# Patient Record
Sex: Female | Born: 1980 | Race: White | Hispanic: No | Marital: Married | Smoking: Current every day smoker
Health system: Southern US, Community
[De-identification: ages and names within clinical notes are randomized; demographics above are authoritative.]

## PROBLEM LIST (undated history)

## (undated) DIAGNOSIS — I499 Cardiac arrhythmia, unspecified: Secondary | ICD-10-CM

## (undated) DIAGNOSIS — F419 Anxiety disorder, unspecified: Secondary | ICD-10-CM

## (undated) DIAGNOSIS — R569 Unspecified convulsions: Secondary | ICD-10-CM

## (undated) DIAGNOSIS — K8050 Calculus of bile duct without cholangitis or cholecystitis without obstruction: Secondary | ICD-10-CM

## (undated) DIAGNOSIS — E282 Polycystic ovarian syndrome: Secondary | ICD-10-CM

## (undated) HISTORY — PX: WISDOM TOOTH EXTRACTION: SHX21

---

## 2001-04-13 DIAGNOSIS — R569 Unspecified convulsions: Secondary | ICD-10-CM

## 2001-04-13 HISTORY — DX: Unspecified convulsions: R56.9

## 2017-03-07 DIAGNOSIS — D72829 Elevated white blood cell count, unspecified: Secondary | ICD-10-CM | POA: Diagnosis not present

## 2017-03-07 DIAGNOSIS — R197 Diarrhea, unspecified: Secondary | ICD-10-CM | POA: Diagnosis not present

## 2017-03-07 DIAGNOSIS — K8020 Calculus of gallbladder without cholecystitis without obstruction: Secondary | ICD-10-CM | POA: Diagnosis not present

## 2017-03-07 DIAGNOSIS — R1013 Epigastric pain: Secondary | ICD-10-CM | POA: Diagnosis not present

## 2017-03-07 DIAGNOSIS — K760 Fatty (change of) liver, not elsewhere classified: Secondary | ICD-10-CM | POA: Diagnosis not present

## 2017-03-07 DIAGNOSIS — R112 Nausea with vomiting, unspecified: Secondary | ICD-10-CM | POA: Diagnosis not present

## 2017-03-10 DIAGNOSIS — K8010 Calculus of gallbladder with chronic cholecystitis without obstruction: Secondary | ICD-10-CM | POA: Diagnosis not present

## 2017-03-15 DIAGNOSIS — K8010 Calculus of gallbladder with chronic cholecystitis without obstruction: Secondary | ICD-10-CM | POA: Diagnosis not present

## 2017-03-15 HISTORY — PX: LAPAROSCOPIC CHOLECYSTECTOMY: SUR755

## 2017-03-20 DIAGNOSIS — K8050 Calculus of bile duct without cholangitis or cholecystitis without obstruction: Secondary | ICD-10-CM | POA: Diagnosis not present

## 2017-03-20 DIAGNOSIS — N3000 Acute cystitis without hematuria: Secondary | ICD-10-CM | POA: Diagnosis not present

## 2017-03-20 DIAGNOSIS — R1110 Vomiting, unspecified: Secondary | ICD-10-CM | POA: Diagnosis not present

## 2017-03-20 DIAGNOSIS — R1031 Right lower quadrant pain: Secondary | ICD-10-CM | POA: Diagnosis not present

## 2017-03-20 DIAGNOSIS — R1011 Right upper quadrant pain: Secondary | ICD-10-CM | POA: Diagnosis not present

## 2017-03-20 DIAGNOSIS — R112 Nausea with vomiting, unspecified: Secondary | ICD-10-CM | POA: Diagnosis not present

## 2017-03-21 DIAGNOSIS — N390 Urinary tract infection, site not specified: Secondary | ICD-10-CM | POA: Diagnosis not present

## 2017-03-21 DIAGNOSIS — R1011 Right upper quadrant pain: Secondary | ICD-10-CM | POA: Diagnosis not present

## 2017-03-21 DIAGNOSIS — R740 Nonspecific elevation of levels of transaminase and lactic acid dehydrogenase [LDH]: Secondary | ICD-10-CM | POA: Diagnosis not present

## 2017-03-21 DIAGNOSIS — Z87891 Personal history of nicotine dependence: Secondary | ICD-10-CM | POA: Diagnosis not present

## 2017-03-21 DIAGNOSIS — R1031 Right lower quadrant pain: Secondary | ICD-10-CM | POA: Diagnosis not present

## 2017-03-21 DIAGNOSIS — R1110 Vomiting, unspecified: Secondary | ICD-10-CM | POA: Diagnosis not present

## 2017-03-21 DIAGNOSIS — K8050 Calculus of bile duct without cholangitis or cholecystitis without obstruction: Secondary | ICD-10-CM | POA: Diagnosis not present

## 2017-03-21 DIAGNOSIS — K8030 Calculus of bile duct with cholangitis, unspecified, without obstruction: Secondary | ICD-10-CM | POA: Diagnosis not present

## 2017-03-21 DIAGNOSIS — R112 Nausea with vomiting, unspecified: Secondary | ICD-10-CM | POA: Diagnosis not present

## 2017-03-21 DIAGNOSIS — N3000 Acute cystitis without hematuria: Secondary | ICD-10-CM | POA: Diagnosis not present

## 2017-03-22 DIAGNOSIS — N390 Urinary tract infection, site not specified: Secondary | ICD-10-CM | POA: Diagnosis not present

## 2017-03-23 ENCOUNTER — Encounter (HOSPITAL_COMMUNITY): Payer: Self-pay | Admitting: Physician Assistant

## 2017-03-23 ENCOUNTER — Inpatient Hospital Stay (HOSPITAL_COMMUNITY): Payer: BLUE CROSS/BLUE SHIELD

## 2017-03-23 ENCOUNTER — Other Ambulatory Visit: Payer: Self-pay

## 2017-03-23 ENCOUNTER — Inpatient Hospital Stay (HOSPITAL_COMMUNITY)
Admission: AD | Admit: 2017-03-23 | Discharge: 2017-03-29 | DRG: 394 | Disposition: A | Discharge status: Home / Self Care | Payer: BLUE CROSS/BLUE SHIELD | Source: Other Acute Inpatient Hospital | Attending: Internal Medicine | Admitting: Internal Medicine

## 2017-03-23 DIAGNOSIS — R740 Nonspecific elevation of levels of transaminase and lactic acid dehydrogenase [LDH]: Secondary | ICD-10-CM

## 2017-03-23 DIAGNOSIS — Z6832 Body mass index (BMI) 32.0-32.9, adult: Secondary | ICD-10-CM | POA: Diagnosis not present

## 2017-03-23 DIAGNOSIS — E669 Obesity, unspecified: Secondary | ICD-10-CM | POA: Diagnosis present

## 2017-03-23 DIAGNOSIS — Z87891 Personal history of nicotine dependence: Secondary | ICD-10-CM

## 2017-03-23 DIAGNOSIS — K8020 Calculus of gallbladder without cholecystitis without obstruction: Secondary | ICD-10-CM | POA: Diagnosis not present

## 2017-03-23 DIAGNOSIS — K9189 Other postprocedural complications and disorders of digestive system: Principal | ICD-10-CM | POA: Diagnosis present

## 2017-03-23 DIAGNOSIS — K839 Disease of biliary tract, unspecified: Secondary | ICD-10-CM | POA: Diagnosis not present

## 2017-03-23 DIAGNOSIS — R7401 Elevation of levels of liver transaminase levels: Secondary | ICD-10-CM

## 2017-03-23 DIAGNOSIS — E282 Polycystic ovarian syndrome: Secondary | ICD-10-CM | POA: Diagnosis present

## 2017-03-23 DIAGNOSIS — R1084 Generalized abdominal pain: Secondary | ICD-10-CM | POA: Diagnosis not present

## 2017-03-23 DIAGNOSIS — K832 Perforation of bile duct: Secondary | ICD-10-CM | POA: Diagnosis not present

## 2017-03-23 DIAGNOSIS — K7689 Other specified diseases of liver: Secondary | ICD-10-CM

## 2017-03-23 DIAGNOSIS — K8309 Other cholangitis: Secondary | ICD-10-CM | POA: Diagnosis not present

## 2017-03-23 DIAGNOSIS — E785 Hyperlipidemia, unspecified: Secondary | ICD-10-CM | POA: Diagnosis present

## 2017-03-23 DIAGNOSIS — K838 Other specified diseases of biliary tract: Secondary | ICD-10-CM | POA: Diagnosis not present

## 2017-03-23 DIAGNOSIS — R945 Abnormal results of liver function studies: Secondary | ICD-10-CM | POA: Diagnosis not present

## 2017-03-23 DIAGNOSIS — N390 Urinary tract infection, site not specified: Secondary | ICD-10-CM | POA: Diagnosis not present

## 2017-03-23 DIAGNOSIS — E876 Hypokalemia: Secondary | ICD-10-CM | POA: Diagnosis not present

## 2017-03-23 DIAGNOSIS — R188 Other ascites: Secondary | ICD-10-CM | POA: Diagnosis not present

## 2017-03-23 DIAGNOSIS — R9389 Abnormal findings on diagnostic imaging of other specified body structures: Secondary | ICD-10-CM | POA: Diagnosis not present

## 2017-03-23 DIAGNOSIS — K760 Fatty (change of) liver, not elsewhere classified: Secondary | ICD-10-CM | POA: Diagnosis not present

## 2017-03-23 DIAGNOSIS — R109 Unspecified abdominal pain: Secondary | ICD-10-CM | POA: Diagnosis not present

## 2017-03-23 DIAGNOSIS — Z4659 Encounter for fitting and adjustment of other gastrointestinal appliance and device: Secondary | ICD-10-CM | POA: Diagnosis not present

## 2017-03-23 DIAGNOSIS — K8050 Calculus of bile duct without cholangitis or cholecystitis without obstruction: Secondary | ICD-10-CM

## 2017-03-23 DIAGNOSIS — Z9049 Acquired absence of other specified parts of digestive tract: Secondary | ICD-10-CM | POA: Diagnosis not present

## 2017-03-23 DIAGNOSIS — Z8719 Personal history of other diseases of the digestive system: Secondary | ICD-10-CM

## 2017-03-23 DIAGNOSIS — K8030 Calculus of bile duct with cholangitis, unspecified, without obstruction: Secondary | ICD-10-CM | POA: Diagnosis present

## 2017-03-23 DIAGNOSIS — Z888 Allergy status to other drugs, medicaments and biological substances status: Secondary | ICD-10-CM

## 2017-03-23 DIAGNOSIS — Z23 Encounter for immunization: Secondary | ICD-10-CM

## 2017-03-23 DIAGNOSIS — R1031 Right lower quadrant pain: Secondary | ICD-10-CM | POA: Diagnosis not present

## 2017-03-23 DIAGNOSIS — K668 Other specified disorders of peritoneum: Secondary | ICD-10-CM | POA: Diagnosis not present

## 2017-03-23 HISTORY — DX: Polycystic ovarian syndrome: E28.2

## 2017-03-23 HISTORY — DX: Unspecified convulsions: R56.9

## 2017-03-23 HISTORY — DX: Calculus of bile duct without cholangitis or cholecystitis without obstruction: K80.50

## 2017-03-23 LAB — CBC WITH DIFFERENTIAL/PLATELET
Basophils Absolute: 0 K/uL (ref 0.0–0.1)
Basophils Relative: 0 %
Eosinophils Absolute: 0.1 K/uL (ref 0.0–0.7)
Eosinophils Relative: 1 %
HCT: 40.2 % (ref 36.0–46.0)
Hemoglobin: 13.1 g/dL (ref 12.0–15.0)
Lymphocytes Relative: 12 %
Lymphs Abs: 1.3 K/uL (ref 0.7–4.0)
MCH: 30.2 pg (ref 26.0–34.0)
MCHC: 32.6 g/dL (ref 30.0–36.0)
MCV: 92.6 fL (ref 78.0–100.0)
Monocytes Absolute: 1.2 K/uL — ABNORMAL HIGH (ref 0.1–1.0)
Monocytes Relative: 10 %
Neutro Abs: 8.7 K/uL — ABNORMAL HIGH (ref 1.7–7.7)
Neutrophils Relative %: 77 %
Platelets: 274 K/uL (ref 150–400)
RBC: 4.34 MIL/uL (ref 3.87–5.11)
RDW: 13.6 % (ref 11.5–15.5)
WBC: 11.3 K/uL — ABNORMAL HIGH (ref 4.0–10.5)

## 2017-03-23 LAB — COMPREHENSIVE METABOLIC PANEL WITH GFR
ALT: 184 U/L — ABNORMAL HIGH (ref 14–54)
AST: 28 U/L (ref 15–41)
Albumin: 3.2 g/dL — ABNORMAL LOW (ref 3.5–5.0)
Alkaline Phosphatase: 288 U/L — ABNORMAL HIGH (ref 38–126)
Anion gap: 11 (ref 5–15)
BUN: <5 mg/dL — ABNORMAL LOW (ref 6–20)
CO2: 18 mmol/L — ABNORMAL LOW (ref 22–32)
Calcium: 8.7 mg/dL — ABNORMAL LOW (ref 8.9–10.3)
Chloride: 109 mmol/L (ref 101–111)
Creatinine, Ser: 0.6 mg/dL (ref 0.44–1.00)
GFR calc Af Amer: >60 mL/min (ref >60)
GFR calc non Af Amer: >60 mL/min (ref >60)
Glucose, Bld: 75 mg/dL (ref 65–99)
Potassium: 3.7 mmol/L (ref 3.5–5.1)
Sodium: 138 mmol/L (ref 135–145)
Total Bilirubin: 2.3 mg/dL — ABNORMAL HIGH (ref 0.3–1.2)
Total Protein: 6.4 g/dL — ABNORMAL LOW (ref 6.5–8.1)

## 2017-03-23 LAB — PROTIME-INR
INR: 1.13
Prothrombin Time: 14.4 seconds (ref 11.4–15.2)

## 2017-03-23 LAB — GLUCOSE, CAPILLARY: Glucose-Capillary: 78 mg/dL (ref 65–99)

## 2017-03-23 LAB — HEMOGLOBIN A1C
Hgb A1c MFr Bld: 4.9 % (ref 4.8–5.6)
Mean Plasma Glucose: 93.93 mg/dL

## 2017-03-23 LAB — APTT: aPTT: 30 seconds (ref 24–36)

## 2017-03-23 MED ORDER — OXYCODONE-ACETAMINOPHEN 5-325 MG PO TABS
1.0000 | ORAL_TABLET | Freq: Four times a day (QID) | ORAL | Status: DC | PRN
  Administered 2017-03-24 – 2017-03-29 (×16): 2 via ORAL
  Filled 2017-03-23 (×6): qty 2
  Filled 2017-03-23: qty 1
  Filled 2017-03-23 (×5): qty 2
  Filled 2017-03-23: qty 1
  Filled 2017-03-23 (×5): qty 2

## 2017-03-23 MED ORDER — METRONIDAZOLE IN NACL 5-0.79 MG/ML-% IV SOLN
500.0000 mg | Freq: Three times a day (TID) | INTRAVENOUS | Status: DC
  Administered 2017-03-23 – 2017-03-26 (×9): 500 mg via INTRAVENOUS
  Filled 2017-03-23 (×11): qty 100

## 2017-03-23 MED ORDER — ACETAMINOPHEN 325 MG PO TABS
650.0000 mg | ORAL_TABLET | Freq: Four times a day (QID) | ORAL | Status: DC | PRN
  Administered 2017-03-25 – 2017-03-28 (×3): 650 mg via ORAL
  Filled 2017-03-23 (×3): qty 2

## 2017-03-23 MED ORDER — SODIUM CHLORIDE 0.9 % IV SOLN: INTRAVENOUS | Status: DC

## 2017-03-23 MED ORDER — BISACODYL 10 MG RE SUPP
10.0000 mg | Freq: Every day | RECTAL | Status: DC | PRN
  Administered 2017-03-26: 10 mg via RECTAL
  Filled 2017-03-23: qty 1

## 2017-03-23 MED ORDER — ONDANSETRON HCL 4 MG PO TABS: 4.0000 mg | ORAL_TABLET | Freq: Four times a day (QID) | ORAL | Status: DC | PRN

## 2017-03-23 MED ORDER — HYDROMORPHONE HCL 1 MG/ML IJ SOLN
0.5000 mg | INTRAMUSCULAR | Status: DC | PRN
  Administered 2017-03-23 – 2017-03-28 (×17): 1 mg via INTRAVENOUS
  Filled 2017-03-23 (×17): qty 1

## 2017-03-23 MED ORDER — DEXTROSE 5 % IV SOLN
2.0000 g | INTRAVENOUS | Status: DC
  Administered 2017-03-23 – 2017-03-28 (×6): 2 g via INTRAVENOUS
  Filled 2017-03-23 (×7): qty 2

## 2017-03-23 MED ORDER — SENNOSIDES-DOCUSATE SODIUM 8.6-50 MG PO TABS: 1.0000 | ORAL_TABLET | Freq: Every evening | ORAL | Status: DC | PRN

## 2017-03-23 MED ORDER — ACETAMINOPHEN 650 MG RE SUPP: 650.0000 mg | Freq: Four times a day (QID) | RECTAL | Status: DC | PRN

## 2017-03-23 MED ORDER — INFLUENZA VAC SPLIT QUAD 0.5 ML IM SUSY
0.5000 mL | PREFILLED_SYRINGE | INTRAMUSCULAR | Status: AC
  Administered 2017-03-25: 0.5 mL via INTRAMUSCULAR
  Filled 2017-03-23: qty 0.5

## 2017-03-23 MED ORDER — PNEUMOCOCCAL VAC POLYVALENT 25 MCG/0.5ML IJ INJ
0.5000 mL | INJECTION | INTRAMUSCULAR | Status: AC
  Administered 2017-03-25: 0.5 mL via INTRAMUSCULAR
  Filled 2017-03-23: qty 0.5

## 2017-03-23 MED ORDER — ONDANSETRON HCL 4 MG/2ML IJ SOLN
4.0000 mg | Freq: Four times a day (QID) | INTRAMUSCULAR | Status: DC | PRN
Start: 2017-03-23 — End: 2017-03-29
  Administered 2017-03-24 – 2017-03-28 (×4): 4 mg via INTRAVENOUS
  Filled 2017-03-23 (×4): qty 2

## 2017-03-23 MED ORDER — MORPHINE SULFATE (PF) 4 MG/ML IV SOLN: 1.0000 mg | INTRAVENOUS | Status: DC | PRN

## 2017-03-23 MED ORDER — DEXTROSE-NACL 5-0.45 % IV SOLN
INTRAVENOUS | Status: DC
  Administered 2017-03-23 – 2017-03-26 (×6): via INTRAVENOUS

## 2017-03-23 MED ORDER — TRAMADOL HCL 50 MG PO TABS
50.0000 mg | ORAL_TABLET | Freq: Four times a day (QID) | ORAL | Status: DC | PRN
  Administered 2017-03-23: 50 mg via ORAL
  Filled 2017-03-23: qty 1

## 2017-03-23 MED ORDER — LORAZEPAM 2 MG/ML IJ SOLN
1.0000 mg | Freq: Once | INTRAMUSCULAR | Status: AC
  Administered 2017-03-23: 1 mg via INTRAVENOUS
  Filled 2017-03-23: qty 1

## 2017-03-23 NOTE — Progress Notes (Signed)
Pharmacy Antibiotic Note Brenda Luna is a 36 y.o. female admitted on 03/23/2017 with concern forcholangitis and possible stone at the ampulla. To start on ceftriaxone and Flagyl.  Plan: 1. Ceftriaxone 2 grams IV every 24 hours  2. Flagyl 500 mg IV every 8 hours  3. Will follow peripherally   Height: 5\' 7"  (170.2 cm) Weight: 205 lb 4 oz (93.1 kg) IBW/kg (Calculated) : 61.6  Temp (24hrs), Avg:98.2 F (36.8 C), Min:98.2 F (36.8 C), Max:98.2 F (36.8 C)   Allergies  Allergen Reactions  . Wellbutrin [Bupropion] Other (See Comments)    SEIZURES    Thank you for allowing pharmacy to be a part of this patient's care.  Brenda Luna 03/23/2017 11:12 AM

## 2017-03-23 NOTE — Progress Notes (Signed)
Offered Pt a fresh bath once she got settled in the room. Pt stated that she freshen up before she leaving the previous hospital.

## 2017-03-23 NOTE — Progress Notes (Signed)
Pt arrived to unit via EMS at approximately 9:20 AM, Pt ambulated from stretcher to bed.   Belonging were with her and placed in closet

## 2017-03-23 NOTE — H&P (Signed)
History and Physical    Brenda Luna ATF:[id number] DOB: [birth year] DOA: 03/23/2017   PCP: No primary care provider on file.   Patient coming from:  Home    Chief Complaint: Abdominal pain   HPI: Brenda Luna is a 36 y.o. female with medical history significant for PCO S, and recent elective cholecystectomy at [facility] 1 week ago, and initial uneventful postop course, discharged to home, but continued to have right upper quadrant pain, progressing to severe, associated with nausea, no vomiting or jaundice, and without fever, but so unbearable that she came to the ER on 03/21/2017.  She had a CT performed at [facility], showing possible retained stone, possible cholangitis, but no pancreatitis.  At [facility], MRCP or ERCP was recommended, but this is not available they are, they are in for was transferred to [facility], for further evaluation, management of her symptoms, and consultation with GI for possible ERCP versus MRCP.  She also was noted to have leukocytosis, and elevated LFTs, including bilirubin.  She was given empiric antibiotic with Rocephin, and supportive therapy with antiemetics, and pain medications   Presented to [facility] with biliary colic, status post lap chole at [facility] 1 week ago,  leukocytosis, elevated LFTs including bilirubin  She was transferred here for MRCP.   Denies fevers, chills, night sweats, vision changes, or mucositis. Denies any respiratory complaints. Denies any chest pain or palpitations. Denies lower extremity swelling. Denies nausea, heartburn Denies abnormal skin rashes, or neuropathy. Denies any bleeding issues such as epistaxis, hematemesis, hematuria or hematochezia. SHe reports darker urine. No dysuria .  Ambulating without difficulty.   Admission  Course:  BP 128/71 (BP Location: Right Arm)   Pulse 92   Temp 98.2 F (36.8 C) (Oral)   Resp 20   Ht _0  (1.702 m)   Wt 93.1 kg (205 lb 4 oz)   SpO2 100%    BMI 32.15 kg/m   According to the records, CT of 03/20/2017 after cholecystectomy showed a 13 mm lobulated mass, inflamed soft tissue versus retained stone at the ampulla, mild biliary dilatation, and postoperative biliary enhancement versus cholangitis.  Small volume of free fluid, no abscess.   GI consultation to be obtained, depending on the results may need surgical evaluation as well. All labs are currently pending.  Last labs on 03/22/2017 showed sodium 134, potassium 4.1, BUN 4, creatinine 0.5, glucose 83.  CBC showed hemoglobin 14.4, hematocrit 44, white count 14.2, and platelets 240.  Other data includes total bilirubin 3.2 on 03/20/2017, with AST of 131, ALT 574, alkaline phosphatase 360, total protein 7.7, albumin 4.8, lipase 149  Review of Systems:  As per HPI otherwise all other systems reviewed and are negative  Past Medical History:  Diagnosis Date  . Biliary colic   . PCOS (polycystic ovarian syndrome)     Past Surgical History:  Procedure Laterality Date  . LAPAROSCOPIC CHOLECYSTECTOMY      Social History Social History   Socioeconomic History  . Marital status: Married    Spouse name: Not on file  . Number of children: Not on file  . Years of education: Not on file  . Highest education level: Not on file  Social Needs  . Financial resource strain: Not on file  . Food insecurity - worry: Not on file  . Food insecurity - inability: Not on file  . Transportation needs - medical: Not on file  . Transportation needs - non-medical: Not on file  Occupational History  . Not on file  Tobacco Use  . Smoking status: Never Smoker  . Smokeless tobacco: Never Used  Substance and Sexual Activity  . Alcohol use: No    Frequency: Never  . Drug use: No  . Sexual activity: Yes  Other Topics Concern  . Not on file  Social History Narrative  . Not on file     Allergies  Allergen Reactions  . Wellbutrin [Bupropion] Other (See Comments)    SEIZURES    Family  History  Problem Relation Age of Onset  . Gallbladder disease Mother   . Hypertension Father       Prior to Admission medications   Not on File    Physical Exam:  Vitals:   03/23/17 0938  BP: 128/71  Pulse: 92  Resp: 20  Temp: 98.2 F (36.8 C)  TempSrc: Oral  SpO2: 100%  Weight: 93.1 kg (205 lb 4 oz)  Height: _0  (1.702 m)   Constitutional: NAD, calm, comfortable   Eyes: PERRL, lids and conjunctivae normal ENMT: Mucous membranes are moist, without exudate or lesions  Neck: normal, supple, no masses, no thyromegaly Respiratory: clear to auscultation bilaterally, no wheezing, no crackles. Normal respiratory effort  Cardiovascular: Regular rate and rhythm,  murmur, rubs or gallops. No extremity edema. 2+ pedal pulses. No carotid bruits.  Abdomen: obese, RUQ tenderness, No hepatosplenomegaly. Bowel sounds positive.  Musculoskeletal: no clubbing / cyanosis. Moves all extremities Skin: no jaundice, No lesions.  Neurologic: Sensation intact  Strength equal in all extremities Psychiatric:   Alert and oriented x 3. Normal mood.     Labs on Admission:    CBC: No results for input(s): WBC, NEUTROABS, HGB, HCT, MCV, PLT in the last 168 hours.  Basic Metabolic Panel: No results for input(s): NA, K, CL, CO2, GLUCOSE, BUN, CREATININE, CALCIUM, MG, PHOS in the last 168 hours.  GFR: CrCl cannot be calculated (No order found.).  Liver Function Tests: No results for input(s): AST, ALT, ALKPHOS, BILITOT, PROT, ALBUMIN in the last 168 hours. No results for input(s): LIPASE, AMYLASE in the last 168 hours. No results for input(s): AMMONIA in the last 168 hours.  Coagulation Profile: No results for input(s): INR, PROTIME in the last 168 hours.  Cardiac Enzymes: No results for input(s): CKTOTAL, CKMB, CKMBINDEX, TROPONINI in the last 168 hours.  BNP (last 3 results) No results for input(s): PROBNP in the last 8760 hours.  HbA1C: No results for input(s): HGBA1C in the last 72  hours.  CBG: No results for input(s): GLUCAP in the last 168 hours.  Lipid Profile: No results for input(s): CHOL, HDL, LDLCALC, TRIG, CHOLHDL, LDLDIRECT in the last 72 hours.  Thyroid Function Tests: No results for input(s): TSH, T4TOTAL, FREET4, T3FREE, THYROIDAB in the last 72 hours.  Anemia Panel: No results for input(s): VITAMINB12, FOLATE, FERRITIN, TIBC, IRON, RETICCTPCT in the last 72 hours.  Urine analysis: No results found for: COLORURINE, APPEARANCEUR, LABSPEC, PHURINE, GLUCOSEU, HGBUR, BILIRUBINUR, KETONESUR, PROTEINUR, UROBILINOGEN, NITRITE, LEUKOCYTESUR  Sepsis Labs: _1 (procalcitonin:4,lacticidven:4) )No results found for this or any previous visit (from the past 240 hour(s)).   Radiological Exams on Admission: No results found.  EKG: Independently reviewed.  Assessment/Plan Active Problems:   History of biliary colic   UTI (urinary tract infection)   Abdominal pain   Transaminitis   Cholangitis   Right upper quadrant pain, in the patient with possible retained stone-choledocholithiasis after cholecystectomy 7 days ago at [facility], and possible cholangitis per CT report  Admit to MedSurg. In p.o. except for sips of medications Continue pain medications with IV Dilaudid versus morphine, as well as oxycodone for pain IV antiemetics with Zofran Continue Rocephin and Flagyl for possible cholangitis Obtain GI consultation for possible ERCP-MRCP  UTI, as evidence for leukocyturia at the former hospital.  WBC is pending. Continue Rocephin Check CBC  Transaminitis, likely due to cholangitis, retained stone or normal postop. Check C met today.  History of PCO S Patient may resume her home OCP and spironolactone at discharge.  No reconciliation was done, she has not taken these meds for about 2 weeks.  CT of the abdomen and pelvis showed inflamed biliary tree with suspicion of cholangitis and possible stone at the ampulla  DVT prophylaxis:  SCDs for now Code Status:    Full Family Communication:  Discussed with patient Disposition Plan: Expect patient to be discharged to home after condition improves Consults called:    GI Admission status: MedSurg inpatient   Sharene Butters, PA-C [facility]   03/23/2017, 11:06 AM

## 2017-03-24 ENCOUNTER — Inpatient Hospital Stay (HOSPITAL_COMMUNITY): Payer: BLUE CROSS/BLUE SHIELD

## 2017-03-24 DIAGNOSIS — E876 Hypokalemia: Secondary | ICD-10-CM

## 2017-03-24 DIAGNOSIS — K9189 Other postprocedural complications and disorders of digestive system: Principal | ICD-10-CM

## 2017-03-24 LAB — HIV ANTIBODY (ROUTINE TESTING W REFLEX): HIV Screen 4th Generation wRfx: NONREACTIVE

## 2017-03-24 LAB — URINALYSIS, ROUTINE W REFLEX MICROSCOPIC
Bacteria, UA: NONE SEEN
Bilirubin Urine: NEGATIVE
Glucose, UA: NEGATIVE mg/dL
Ketones, ur: 80 mg/dL — AB
Leukocytes, UA: NEGATIVE
Nitrite: NEGATIVE
Protein, ur: NEGATIVE mg/dL
Specific Gravity, Urine: 1.009 (ref 1.005–1.030)
pH: 6 (ref 5.0–8.0)

## 2017-03-24 LAB — COMPREHENSIVE METABOLIC PANEL
ALT: 133 U/L — ABNORMAL HIGH (ref 14–54)
AST: 19 U/L (ref 15–41)
Albumin: 2.8 g/dL — ABNORMAL LOW (ref 3.5–5.0)
Alkaline Phosphatase: 232 U/L — ABNORMAL HIGH (ref 38–126)
Anion gap: 11 (ref 5–15)
BUN: <5 mg/dL — ABNORMAL LOW (ref 6–20)
CO2: 18 mmol/L — ABNORMAL LOW (ref 22–32)
Calcium: 8.4 mg/dL — ABNORMAL LOW (ref 8.9–10.3)
Chloride: 106 mmol/L (ref 101–111)
Creatinine, Ser: 0.52 mg/dL (ref 0.44–1.00)
GFR calc Af Amer: >60 mL/min (ref >60)
GFR calc non Af Amer: >60 mL/min (ref >60)
Glucose, Bld: 97 mg/dL (ref 65–99)
Potassium: 3.3 mmol/L — ABNORMAL LOW (ref 3.5–5.1)
Sodium: 135 mmol/L (ref 135–145)
Total Bilirubin: 1.8 mg/dL — ABNORMAL HIGH (ref 0.3–1.2)
Total Protein: 6 g/dL — ABNORMAL LOW (ref 6.5–8.1)

## 2017-03-24 LAB — PROTIME-INR
INR: 1.08
Prothrombin Time: 13.9 seconds (ref 11.4–15.2)

## 2017-03-24 LAB — CBC
HCT: 38.4 % (ref 36.0–46.0)
Hemoglobin: 13.1 g/dL (ref 12.0–15.0)
MCH: 30.9 pg (ref 26.0–34.0)
MCHC: 34.1 g/dL (ref 30.0–36.0)
MCV: 90.6 fL (ref 78.0–100.0)
Platelets: 301 10*3/uL (ref 150–400)
RBC: 4.24 MIL/uL (ref 3.87–5.11)
RDW: 13.3 % (ref 11.5–15.5)
WBC: 9.4 10*3/uL (ref 4.0–10.5)

## 2017-03-24 MED ORDER — POTASSIUM CHLORIDE 10 MEQ/100ML IV SOLN: 10.0000 meq | Freq: Once | INTRAVENOUS | Status: DC

## 2017-03-24 MED ORDER — POTASSIUM CHLORIDE 10 MEQ/100ML IV SOLN
10.0000 meq | INTRAVENOUS | Status: AC
  Administered 2017-03-24 (×4): 10 meq via INTRAVENOUS
  Filled 2017-03-24 (×4): qty 100

## 2017-03-24 MED ORDER — TECHNETIUM TC 99M MEBROFENIN IV KIT
5.0000 | PACK | Freq: Once | INTRAVENOUS | Status: AC | PRN
  Administered 2017-03-24: 5 via INTRAVENOUS

## 2017-03-24 NOTE — Progress Notes (Signed)
Nutrition Brief Note  Patient identified on the Malnutrition Screening Tool (MST) Report  Wt Readings from Last 15 Encounters:  03/24/17 205 lb 4 oz (93.1 kg)   Brenda LeveringJennifer Luna is a 36 y.o. female with medical history significant for PCO S, and recent elective cholecystectomy at [facility] 1 week ago, and initial uneventful postop course, discharged to home, but continued to have right upper quadrant pain, progressing to severe, associated with nausea, no vomiting or jaundice, and without fever, but so unbearable that she came to the ER on 03/21/2017.   Pt admitted with rt upper quadrant pain, concerning for possible retained stone or cholangitis. Pt awaiting GI evaluation for possible ERCP.   Spoke with pt and mother at bedside. Pt reports improving appetite after elective cholecystectomy on 03/15/17. However, pt with poor appetite over the past 3-4 days PTA related to abdominal pain.   Pt shares that she has lost about 13# over the past 2 weeks, however, this is not consistent with wt hx (pt has lost 3# (1.4%) within the [facility], which is not significant for time frame).   Nutrition-Focused physical exam completed. Findings are no fat depletion, no muscle depletion, and no edema.   Pt denies any further nutritional needs at this time, however, appreciative for visit. She is hopeful that her appetite will improve once work-up is completed  Body mass index is 32.15 kg/m. Patient meets criteria for obesity, class I based on current BMI.   Current diet order is NPO, patient is consuming approximately n/a% of meals at this time. Labs and medications reviewed.   No nutrition interventions warranted at this time. If nutrition issues arise, please consult RD.   Marykay Mccleod A. Mayford KnifeWilliams, RD, LDN, CDE Pager: [phone] After hours Pager: [phone]

## 2017-03-24 NOTE — Progress Notes (Signed)
PROGRESS NOTE   Brenda LeveringJennifer Luna  ZOX:[id number]    DOB: 09/19/1980    DOA: 03/23/2017  PCP: Kendra Opitzole, Dawn Watkins, NP   I have briefly reviewed patients previous [facility].  Brief Narrative:  36 year old female patient with PMH of PCOS underwent elective laparoscopic [facility] on 12/[facility], did well for the first couple days then noted progressively worsening abdominal pain and [facility] CT abdomen there showed possible retained stone, possible cholangitis but no pancreatitis. MRCP or ERCP were recommended but not available [facility] and [facility]. MRCP >HIDA scan confirmed biliary leak.  GI and CCS consulted. Tentatively plan for ERCP to stent bile duct on 02/24/17.   Assessment & Plan:   Active Problems:   History of biliary colic   UTI (urinary tract infection)   Abdominal pain   Transaminitis   Cholangitis   1. Bile leak status post laparoscopic cholecystectomy 12/4: MRCP results appreciated. Consulted and discussed with Dr. Adela LankArmbruster, [facility] who recommended HIDA scan. HIDA scan confirms bile leak. As per GI, ERCP and bile duct stenting on 02/24/17, continue nothing by mouth and IV antibiotics. General surgery on board and hopeful that the stent will work and no surgery will be required. LFTs improving. 2. Hypokalemia: Replace and follow. 3. History of PCOS   DVT prophylaxis: SCD's Code Status: Full Family Communication: None at bedside Disposition: DC home when medically improved.   Consultants:  [facility] General surgery   Procedures:  None  Antimicrobials:  IV ceftriaxone and metronidazole    Subjective: Reports feeling better than on admission. Still has intermittent right upper quadrant pain, down after pain medications to 4/10, radiates to the back and sometimes to the right clavicle. No dyspnea, chest pain. No fever or chills. Passing flatus. No  BM since gallbladder surgery.  ROS: As above  Objective:  Vitals:   03/23/17 1307 03/23/17 2222 03/24/17 0449 03/24/17 0500  BP: 124/73 131/79 118/67   Pulse: 88 93 80   Resp: 18 18 18    Temp: 98.5 F (36.9 C) 99.7 F (37.6 C) 98.7 F (37.1 C)   TempSrc: Oral Oral Oral   SpO2: 93% 97% 97%   Weight:    93.1 kg (205 lb 4 oz)  Height:        Examination:  General exam: Pleasant young female lying comfortably supine in bed. Respiratory system: Clear to auscultation. Respiratory effort normal. Cardiovascular system: S1 & S2 heard, RRR. No JVD, murmurs, rubs, gallops or clicks. No pedal edema. Gastrointestinal system: Abdomen is nondistended, soft. Mild tenderness and guarding in the right upper quadrant without peritoneal signs. Laparoscopic sites without acute findings to suggest infection. Central nervous system: Alert and oriented. No focal neurological deficits. Extremities: Symmetric 5 x 5 power. Skin: No rashes, lesions or ulcers Psychiatry: Judgement and insight appear normal. Mood & affect appropriate.     Data Reviewed: I have personally reviewed following labs and imaging studies  CBC: Recent Labs  Lab 03/23/17 1134 03/24/17 0349  WBC 11.3* 9.4  NEUTROABS 8.7*  --   HGB 13.1 13.1  HCT 40.2 38.4  MCV 92.6 90.6  PLT 274 301   Basic Metabolic Panel: Recent Labs  Lab 03/23/17 1134 03/24/17 0349  NA 138 135  K 3.7 3.3*  CL 109 106  CO2 18* 18*  GLUCOSE 75 97  BUN <5* <5*  CREATININE 0.60 0.52  CALCIUM 8.7* 8.4*   Liver Function Tests:  Recent Labs  Lab 03/23/17 1134 03/24/17 0349  AST 28 19  ALT 184* 133*  ALKPHOS 288* 232*  BILITOT 2.3* 1.8*  PROT 6.4* 6.0*  ALBUMIN 3.2* 2.8*   Coagulation Profile: Recent Labs  Lab 03/23/17 1134 03/24/17 0349  INR 1.13 1.08   HbA1C: Recent Labs    03/23/17 1124  HGBA1C 4.9   CBG: Recent Labs  Lab 03/23/17 1345  GLUCAP 78    No results found for this or any previous visit (from the past 240  hour(s)).       Radiology Studies: Nm Hepatobiliary Liver Func  Result Date: 03/24/2017 CLINICAL DATA:  36 year old female postoperative day 9 coli cystectomy. Abdomen MRI yesterday with increasing perihepatic free fluid suspicious for bile leak. EXAM: NUCLEAR MEDICINE HEPATOBILIARY IMAGING TECHNIQUE: Sequential images of the abdomen were obtained out to 60 minutes following intravenous administration of radiopharmaceutical. RADIOPHARMACEUTICALS:  5  mCi Tc-9m  Choletec IV COMPARISON:  Abdomen MRI 03/23/2017 and earlier. FINDINGS: Initially there is prompt radiotracer uptake by the liver and clearance of the blood pool. CBD activity is visible by 20 minutes. Small bowel activity visible by 20 minutes and increases over the course of the study. At the same time there is subtle abnormal radiotracer accumulation occurring along the inferior right liver tip (Image 13. Progressive radiotracer activity then along the right lateral and superior perihepatic space, such that after 1 hour the more concentrated perihepatic radiotracer activity creates a negative silhouette of the hepatic contour (image 60). This exam was reviewed with Dr. Amil Amen. IMPRESSION: 1. Positive for bile leak with perihepatic accumulation of the excreted hepatic radiotracer corresponding to the perihepatic fluid collection on MRI yesterday. This pattern suggests a subcapsular bile leak. 2. The CBD is patent. Electronically Signed   By: Odessa Fleming M.D.   On: 03/24/2017 15:14   Mr Abdomen Mrcp Wo Contrast  Result Date: 03/24/2017 CLINICAL DATA:  Biliary colic status post laparoscopic cholecystectomy 03/15/2017. Concern for cholangitis and choledocholithiasis. EXAM: MRI ABDOMEN WITHOUT CONTRAST  (INCLUDING MRCP) TECHNIQUE: Multiplanar multisequence MR imaging of the abdomen was performed. Heavily T2-weighted images of the biliary and pancreatic ducts were obtained, and three-dimensional MRCP images were rendered by post processing.  COMPARISON:  Abdominopelvic CT 03/20/2017. FINDINGS: Lower chest: Progressively lower lung volumes with increasing atelectasis at both lung bases. No significant pleural or pericardial effusion. Hepatobiliary: Mild hepatic steatosis with loss of signal on gradient echo opposed phase images. No focal lesions identified on noncontrast imaging. Examination is mildly motion degraded, especially on the thin section MRCP images. The gallbladder is surgically absent. There is no significant fluid collection of the cholecystectomy bed. There is mild biliary dilatation, improved from CT. The common hepatic duct measures up to 9 mm in diameter and the common bile duct 8 mm. No definite retained calculi are seen on the coronal images. On series 8, tiny distal intraductal calculi (images 38 and 39) at the ampulla are difficult to exclude. The ampullary prominence on prior CT is not evident. Pancreas: Unremarkable. No pancreatic ductal dilatation or surrounding inflammatory changes. Spleen: Normal in size without focal abnormality. Adrenals/Urinary Tract: Both adrenal glands appear normal. The kidneys appear normal without evidence of urinary tract calculus or hydronephrosis. Bladder not imaged. Stomach/Bowel: No evidence of bowel wall thickening, distention or surrounding inflammatory change. Vascular/Lymphatic: There are no enlarged abdominal lymph nodes. No significant vascular findings. Other: There is increasing ascites which is predominately perihepatic, measuring up to 3.1 cm in thickness. Some fluid extends into the right pericolic  gutter. No focal extraluminal collection. Musculoskeletal: No acute or significant osseous findings. IMPRESSION: 1. Compared with the CT of 3 days prior, biliary dilatation has slightly improved. No definite choledocholithiasis. Tiny calculi in the distal common bile duct difficult to completely exclude, but the patient's elevated liver function studies are slowly improving. 2. Increasing  perihepatic ascites suspicious for possible biliary leak. Consider nuclear medicine hepatobiliary scan for further evaluation. 3. Mild hepatic steatosis. 4. Progressive atelectasis at both lung bases. Electronically Signed   By: Carey BullocksWilliam  Veazey M.D.   On: 03/24/2017 09:13        Scheduled Meds: . Influenza vac split quadrivalent PF  0.5 mL Intramuscular Tomorrow-1000  . pneumococcal 23 valent vaccine  0.5 mL Intramuscular Tomorrow-1000   Continuous Infusions: . cefTRIAXone (ROCEPHIN)  IV Stopped (03/24/17 1217)  . dextrose 5 % and 0.45% NaCl 125 mL/hr at 03/24/17 0746  . metronidazole Stopped (03/24/17 1330)     LOS: 1 day     Marcellus ScottAnand Jrake Rodriquez, MD, FACP, [facility]. [facility] Pager [phone]  If 7PM-7AM, please contact night-coverage [email or web address] Password [email or web address] 03/24/2017, 5:50 PM

## 2017-03-24 NOTE — Consult Note (Signed)
[facility] Consult: 12:01 PM 03/24/2017  LOS: 1 day    Referring Provider: Dr  Algis Liming  Primary Care Physician:  Maylon Cos, NP and [place] [facility]ologist:  An MD in [facility] did a colonoscopy ~ 2016, but she does not recall MD name.      Reason for Consultation:  Choledocholithiasis, ? Bile leak   HPI: Brenda Luna is a 36 y.o. female.  PMH hyperlipidemia.  Polycystic ovary disease.  Obesity (BMI 32).  Hx hemorrhoidal bleeding, colonoscopy ~ 2016 showed hemorrhoids.    Patient with history of biliary colic with intermittent abdominal pain for several months..  Ultrasound 03/07/17 revealed fatty liver and cholelithiasis but no cholecystitis.  LFTs were normal on same day as ultrasound.   Patient underwent elective lap chole 12/3 at [facility].  Surgery uneventful.  After discharge she was sore but over the recent weekend developed different and progressing to severe right upper quadrant pain.  Radiated into back.  Pain increased with solid food but tolerating liquids. Temporary relief with Tramadol. She went to the ED at [facility].  CT scan there showed "13 mm lamellated mass, inflamed soft tissue versus retain stone at ampulla.  Mild intrahepatic biliary dilatation.  Postoperative biliary enhancement, cholangitis has similar appearance.  Small volume free fluid.  No abscess. "  Because she needed MRCP versus ERCP and neither were available at [facility], she was transferred to [facility]. She has been started on empiric Rocephin.  T bili  2.3 >. 1.8.  Alk phos 288 >> 232.  AST/ALT 28/184 >> 19/133. WBCs 11.3 >> 9.4.  Afebrile. MRCP: Biliary dilatation slightly improved.  No definite choledocholithiasis.  Tiny calculi in the distal common bile duct difficult  to completely exclude.  Increasing perihepatic ascites suspicious for possible biliary leak.  Mild hepatic steatosis.  Bilateral, progressive atelectasis in the lungs  Pain is well controlled with oxycodone. Pt does not drink ETOH, smoke.  Occasional NSAIDs.   Fm hx GB disease in mat great GM, mother, maternal aunts Celiac dz, GERD and "precancerous" colon polyps in Mom.       Past Medical History:  Diagnosis Date  . Biliary colic   . PCOS (polycystic ovarian syndrome)   . Seizures ([place]) 2003   "brought on by RX" (03/23/2017)    Past Surgical History:  Procedure Laterality Date  . LAPAROSCOPIC CHOLECYSTECTOMY  03/15/2017    Prior to Admission medications   Not on File    Scheduled Meds: . Influenza vac split quadrivalent PF  0.5 mL Intramuscular Tomorrow-1000  . pneumococcal 23 valent vaccine  0.5 mL Intramuscular Tomorrow-1000   Infusions: . cefTRIAXone (ROCEPHIN)  IV 2 g (03/24/17 1147)  . dextrose 5 % and 0.45% NaCl 125 mL/hr at 03/24/17 0746  . metronidazole Stopped (03/24/17 0410)   PRN Meds: acetaminophen **OR** acetaminophen, bisacodyl, HYDROmorphone (DILAUDID) injection, ondansetron **OR** ondansetron (ZOFRAN) IV, oxyCODONE-acetaminophen, senna-docusate, traMADol   Allergies as of 03/22/2017  . (Not on File)    Family History  Problem Relation Age of Onset  .  Gallbladder disease Mother   . Hypertension Father     Social History   Socioeconomic History  . Marital status: Married    Spouse name: Not on file  . Number of children: Not on file  . Years of education: Not on file  . Highest education level: Not on file  Social Needs  . Financial resource strain: Not on file  . Food insecurity - worry: Not on file  . Food insecurity - inability: Not on file  . Transportation needs - medical: Not on file  . Transportation needs - non-medical: Not on file  Occupational History  . Not on file  Tobacco Use  . Smoking status: Former Smoker    Packs/day:  0.75    Years: 16.00    Pack years: 12.00    Types: Cigarettes    Last attempt to quit: 03/12/2017    Years since quitting: 0.0  . Smokeless tobacco: Never Used  Substance and Sexual Activity  . Alcohol use: Yes    Frequency: Never    Comment: 03/23/2017 "less than 2 times/year"  . Drug use: No  . Sexual activity: Yes  Other Topics Concern  . Not on file  Social History Narrative  . Not on file    REVIEW OF SYSTEMS: Constitutional:  In general pt has good energy but with recent events feels sluggish ENT:  No nose bleeds Pulm:  breathing deeply intensifies the right upper quadrant pain CV:  No palpitations, no LE edema.  GU:  No hematuria, no frequency GI:  Per HPI.   Heme: No unusual bleeding or bruising. Transfusions: No previous transfusions. Neuro:  No headaches, no peripheral tingling or numbness Derm:  No itching, no rash or sores.  She has tattoos which were performed by professionally trained practitioners. Endocrine:  No sweats or chills.  No polyuria or dysuria Immunization: Did not inquire as to recent or previous vaccinations. Travel:  None beyond local counties in last few months.    PHYSICAL EXAM: Vital signs in last 24 hours: Vitals:   03/23/17 2222 03/24/17 0449  BP: 131/79 118/67  Pulse: 93 80  Resp: 18 18  Temp: 99.7 F (37.6 C) 98.7 F (37.1 C)  SpO2: 97% 97%   Wt Readings from Last 3 Encounters:  03/24/17 93.1 kg (205 lb 4 oz)    General: Pleasant, looks well.  Was walking in hallway with her mother when I first encountered her.  Currently comfortable. Head: No facial asymmetry or swelling.  No signs of head trauma. Eyes: No scleral icterus, no conjunctival pallor. Ears: Not hard of hearing Nose: No discharge or congestion Mouth: Oropharynx moist and clear.  Tongue midline.  Good dentition. Neck: No JVD, no masses, no thyromegaly.  Lungs: Slightly diminished at the bases but clear.  No labored breathing.  No cough Heart: RRR.  No MRG.  S1,  S2 present. Abdomen: Soft.  Tender in the right upper quadrant without guarding or rebound.  Bowel sounds present but hypoactive.  No distention.  Small surgical incisions all clean, dry, intact.  Some bruising around the main incision in the mid abdomen.  Abdominal striae.   Rectal: Deferred Musc/Skeltl: No joint redness, swelling or obvious deformities Extremities: No CCE.  Feet are warm and well perfused. Neurologic: Alert.  Good historian.  Oriented x3.  Moves all 4 limbs with full strength.  No tremor. Skin: No jaundice, no telangiectasia.  No rashes or suspicious lesions. Tattoos: Large tattoo on her back.  Small tattoo on  her breast. Nodes: No cervical adenopathy. Psych: Pleasant, calm, cooperative.  Intake/Output from previous day: 12/11 0701 - 12/12 0700 In: 3825 [I.V.:920; IV Piggyback:250] Out: 1000 [Urine:1000] Intake/Output this shift: No intake/output data recorded.  LAB RESULTS: Recent Labs    03/23/17 1134 03/24/17 0349  WBC 11.3* 9.4  HGB 13.1 13.1  HCT 40.2 38.4  PLT 274 301   BMET Lab Results  Component Value Date   NA 135 03/24/2017   NA 138 03/23/2017   K 3.3 (L) 03/24/2017   K 3.7 03/23/2017   CL 106 03/24/2017   CL 109 03/23/2017   CO2 18 (L) 03/24/2017   CO2 18 (L) 03/23/2017   GLUCOSE 97 03/24/2017   GLUCOSE 75 03/23/2017   BUN <5 (L) 03/24/2017   BUN <5 (L) 03/23/2017   CREATININE 0.52 03/24/2017   CREATININE 0.60 03/23/2017   CALCIUM 8.4 (L) 03/24/2017   CALCIUM 8.7 (L) 03/23/2017   LFT Recent Labs    03/23/17 1134 03/24/17 0349  PROT 6.4* 6.0*  ALBUMIN 3.2* 2.8*  AST 28 19  ALT 184* 133*  ALKPHOS 288* 232*  BILITOT 2.3* 1.8*   PT/INR Lab Results  Component Value Date   INR 1.08 03/24/2017   INR 1.13 03/23/2017   Hepatitis Panel No results for input(s): HEPBSAG, HCVAB, HEPAIGM, HEPBIGM in the last 72 hours. C-Diff No components found for: CDIFF Lipase  No results found for: LIPASE  Drugs of Abuse  No results found  for: LABOPIA, COCAINSCRNUR, LABBENZ, AMPHETMU, THCU, LABBARB   RADIOLOGY STUDIES: Mr Abdomen Mrcp Wo Contrast  Result Date: 03/24/2017 CLINICAL DATA:  Biliary colic status post laparoscopic cholecystectomy 03/15/2017. Concern for cholangitis and choledocholithiasis. EXAM: MRI ABDOMEN WITHOUT CONTRAST  (INCLUDING MRCP) TECHNIQUE: Multiplanar multisequence MR imaging of the abdomen was performed. Heavily T2-weighted images of the biliary and pancreatic ducts were obtained, and three-dimensional MRCP images were rendered by post processing. COMPARISON:  Abdominopelvic CT 03/20/2017. FINDINGS: Lower chest: Progressively lower lung volumes with increasing atelectasis at both lung bases. No significant pleural or pericardial effusion. Hepatobiliary: Mild hepatic steatosis with loss of signal on gradient echo opposed phase images. No focal lesions identified on noncontrast imaging. Examination is mildly motion degraded, especially on the thin section MRCP images. The gallbladder is surgically absent. There is no significant fluid collection of the cholecystectomy bed. There is mild biliary dilatation, improved from CT. The common hepatic duct measures up to 9 mm in diameter and the common bile duct 8 mm. No definite retained calculi are seen on the coronal images. On series 8, tiny distal intraductal calculi (images 38 and 39) at the ampulla are difficult to exclude. The ampullary prominence on prior CT is not evident. Pancreas: Unremarkable. No pancreatic ductal dilatation or surrounding inflammatory changes. Spleen: Normal in size without focal abnormality. Adrenals/Urinary Tract: Both adrenal glands appear normal. The kidneys appear normal without evidence of urinary tract calculus or hydronephrosis. Bladder not imaged. Stomach/Bowel: No evidence of bowel wall thickening, distention or surrounding inflammatory change. Vascular/Lymphatic: There are no enlarged abdominal lymph nodes. No significant vascular  findings. Other: There is increasing ascites which is predominately perihepatic, measuring up to 3.1 cm in thickness. Some fluid extends into the right pericolic gutter. No focal extraluminal collection. Musculoskeletal: No acute or significant osseous findings. IMPRESSION: 1. Compared with the CT of 3 days prior, biliary dilatation has slightly improved. No definite choledocholithiasis. Tiny calculi in the distal common bile duct difficult to completely exclude, but the patient's elevated liver function studies are slowly  improving. 2. Increasing perihepatic ascites suspicious for possible biliary leak. Consider nuclear medicine hepatobiliary scan for further evaluation. 3. Mild hepatic steatosis. 4. Progressive atelectasis at both lung bases. Electronically Signed   By: Richardean Sale M.D.   On: 03/24/2017 09:13     IMPRESSION:   *  Uneventful elective lap chole 03/15/17.   Now with ruq pain and imaging suspicious for CBD stone and possible bile leak.   Being covered with empiric antibiotics of Rocephin and metronidazole.  WBCs improved    PLAN:     *    HIDA scan today.  Hopefully this will give a better indication of whether she has an active bile leak.  *   ERCP planned for Friday 12/14 at 11:30.  Ok to have clears after HIDA.  CMET in AM.  ? need to continue Rocephin and Flagyl?  Azucena Freed  03/24/2017, 12:01 PM Pager: [phone]

## 2017-03-24 NOTE — Consult Note (Signed)
[facility] Consult/Admission Note  Brenda Luna 1980/12/12  [id number].    Requesting MD: Dr. Caron Presume Chief Complaint/Reason for Consult: Bile leak s/p cholecystectomy  HPI:   Pt is a 36 year old female with a history of HLD, PCOS, obesity, hemorrhoids, s/p lap cholecystetomy on 12/04 by Dr. Kathie Dike in [facility] who presented to the [facility] ED with complaints of abdominal pain. Pt states POD4 she began having RUQ abdominal pain. Pain progressively worsened to severe and constant on POD6 which prompted her to go to the ER. Pt states pain was worse with food, radiated to her middle upper back, with associated nausea but no vomiting. Pt has not had a BM since before her surgery on 12/04. Pt denies vomiting, chills, fever, CP, SOB. MRCP 12/12 showed Increasing perihepatic ascites suspicious for possible biliary leak. HIDA showed Positive for bile leak with perihepatic accumulation of the excreted hepatic radiotracer corresponding to the perihepatic fluid collection on MRI yesterday. This pattern suggests a subcapsular bile leak. The CBD is patent.  Labs: Tbili 1.8, AST 19, ALT 133, alk phos 232, no leukocytosis  ROS:  Review of Systems  Constitutional: Negative for chills, diaphoresis and fever.  HENT: Negative for sore throat.   Respiratory: Negative for shortness of breath.   Cardiovascular: Negative for chest pain.  Gastrointestinal: Positive for abdominal pain, constipation and nausea. Negative for vomiting.  Skin: Negative for rash.  Neurological: Negative for dizziness and loss of consciousness.  All other systems reviewed and are negative.    Family History  Problem Relation Age of Onset  . Gallbladder disease Mother   . Hypertension Father     Past Medical History:  Diagnosis Date  . Biliary colic   . PCOS (polycystic ovarian syndrome)   . Seizures ([place]) 2003   "brought on by RX" (03/23/2017)    Past Surgical History:  Procedure Laterality Date  .  LAPAROSCOPIC CHOLECYSTECTOMY  03/15/2017    Social History:  reports that she quit smoking 12 days ago. Her smoking use included cigarettes. She has a 12.00 pack-year smoking history. she has never used smokeless tobacco. She reports that she drinks alcohol. She reports that she does not use drugs.  Allergies:  Allergies  Allergen Reactions  . Wellbutrin [Bupropion] Other (See Comments)    SEIZURES    No medications prior to admission.    Blood pressure 118/67, pulse 80, temperature 98.7 F (37.1 C), temperature source Oral, resp. rate 18, height '5\' 7"'  (1.702 m), weight 205 lb 4 oz (93.1 kg), last menstrual period 02/21/2017, SpO2 97 %.  Physical Exam  Constitutional: She is oriented to person, place, and time and well-developed, well-nourished, and in no distress. Vital signs are normal. No distress.  HENT:  Head: Normocephalic and atraumatic.  Nose: Nose normal.  Eyes: Conjunctivae are normal. Right eye exhibits no discharge. Left eye exhibits no discharge. No scleral icterus.  Pupils are equal and round  Neck: Normal range of motion. Neck supple. No thyromegaly present.  Cardiovascular: Normal rate, regular rhythm, normal heart sounds and intact distal pulses. Exam reveals no gallop and no friction rub.  No murmur heard. Pulses:      Radial pulses are 2+ on the right side, and 2+ on the left side.  Pulmonary/Chest: Effort normal and breath sounds normal. No respiratory distress. She has no decreased breath sounds. She has no wheezes. She has no rhonchi. She has no rales.  Abdominal: Soft. Bowel sounds are normal. She exhibits no distension and no mass.  There is tenderness in the right upper quadrant. There is no rebound and no guarding. No hernia.  Port sites appear to be well healing without signs of infection  Musculoskeletal: Normal range of motion. She exhibits no deformity.  Lymphadenopathy:    She has no cervical adenopathy.  Neurological: She is alert and oriented to  person, place, and time. GCS score is 15.  Skin: Skin is warm and dry. No rash noted. She is not diaphoretic.  Psychiatric: Mood and affect normal.  Nursing note and vitals reviewed.   Results for orders placed or performed during the hospital encounter of 03/23/17 (from the past 48 hour(s))  Hemoglobin A1c     Status: None   Collection Time: 03/23/17 11:24 AM  Result Value Ref Range   Hgb A1c MFr Bld 4.9 4.8 - 5.6 %    Comment: (NOTE) Pre diabetes:          5.7%-6.4% Diabetes:              >6.4% Glycemic control for   <7.0% adults with diabetes    Mean Plasma Glucose 93.93 mg/dL  HIV antibody (Routine Testing)     Status: None   Collection Time: 03/23/17 11:34 AM  Result Value Ref Range   HIV Screen 4th Generation wRfx Non Reactive Non Reactive    Comment: (NOTE) Performed At: [facility] [place], [place] [id number] Rush Farmer MD DE:[id number]   Comprehensive metabolic panel     Status: Abnormal   Collection Time: 03/23/17 11:34 AM  Result Value Ref Range   Sodium 138 135 - 145 mmol/L   Potassium 3.7 3.5 - 5.1 mmol/L   Chloride 109 101 - 111 mmol/L   CO2 18 (L) 22 - 32 mmol/L   Glucose, Bld 75 65 - 99 mg/dL   BUN <5 (L) 6 - 20 mg/dL   Creatinine, Ser 0.60 0.44 - 1.00 mg/dL   Calcium 8.7 (L) 8.9 - 10.3 mg/dL   Total Protein 6.4 (L) 6.5 - 8.1 g/dL   Albumin 3.2 (L) 3.5 - 5.0 g/dL   AST 28 15 - 41 U/L   ALT 184 (H) 14 - 54 U/L   Alkaline Phosphatase 288 (H) 38 - 126 U/L   Total Bilirubin 2.3 (H) 0.3 - 1.2 mg/dL   GFR calc non Af Amer >60 >60 mL/min   GFR calc Af Amer >60 >60 mL/min    Comment: (NOTE) The eGFR has been calculated using the CKD EPI equation. This calculation has not been validated in all clinical situations. eGFR's persistently <60 mL/min signify possible Chronic Kidney Disease.    Anion gap 11 5 - 15  CBC WITH DIFFERENTIAL     Status: Abnormal   Collection Time: 03/23/17 11:34 AM  Result Value Ref Range   WBC 11.3 (H)  4.0 - 10.5 K/uL   RBC 4.34 3.87 - 5.11 MIL/uL   Hemoglobin 13.1 12.0 - 15.0 g/dL   HCT 40.2 36.0 - 46.0 %   MCV 92.6 78.0 - 100.0 fL   MCH 30.2 26.0 - 34.0 pg   MCHC 32.6 30.0 - 36.0 g/dL   RDW 13.6 11.5 - 15.5 %   Platelets 274 150 - 400 K/uL   Neutrophils Relative % 77 %   Neutro Abs 8.7 (H) 1.7 - 7.7 K/uL   Lymphocytes Relative 12 %   Lymphs Abs 1.3 0.7 - 4.0 K/uL   Monocytes Relative 10 %   Monocytes Absolute 1.2 (H) 0.1 - 1.0 K/uL  Eosinophils Relative 1 %   Eosinophils Absolute 0.1 0.0 - 0.7 K/uL   Basophils Relative 0 %   Basophils Absolute 0.0 0.0 - 0.1 K/uL  Protime-INR     Status: None   Collection Time: 03/23/17 11:34 AM  Result Value Ref Range   Prothrombin Time 14.4 11.4 - 15.2 seconds   INR 1.13   APTT     Status: None   Collection Time: 03/23/17 11:34 AM  Result Value Ref Range   aPTT 30 24 - 36 seconds  Glucose, capillary     Status: None   Collection Time: 03/23/17  1:45 PM  Result Value Ref Range   Glucose-Capillary 78 65 - 99 mg/dL  Urinalysis, Routine w reflex microscopic     Status: Abnormal   Collection Time: 03/24/17  3:06 AM  Result Value Ref Range   Color, Urine YELLOW YELLOW   APPearance CLEAR CLEAR   Specific Gravity, Urine 1.009 1.005 - 1.030   pH 6.0 5.0 - 8.0   Glucose, UA NEGATIVE NEGATIVE mg/dL   Hgb urine dipstick SMALL (A) NEGATIVE   Bilirubin Urine NEGATIVE NEGATIVE   Ketones, ur 80 (A) NEGATIVE mg/dL   Protein, ur NEGATIVE NEGATIVE mg/dL   Nitrite NEGATIVE NEGATIVE   Leukocytes, UA NEGATIVE NEGATIVE   RBC / HPF 0-5 0 - 5 RBC/hpf   WBC, UA 0-5 0 - 5 WBC/hpf   Bacteria, UA NONE SEEN NONE SEEN   Squamous Epithelial / LPF 0-5 (A) NONE SEEN   Mucus PRESENT   Comprehensive metabolic panel     Status: Abnormal   Collection Time: 03/24/17  3:49 AM  Result Value Ref Range   Sodium 135 135 - 145 mmol/L   Potassium 3.3 (L) 3.5 - 5.1 mmol/L   Chloride 106 101 - 111 mmol/L   CO2 18 (L) 22 - 32 mmol/L   Glucose, Bld 97 65 - 99 mg/dL    BUN <5 (L) 6 - 20 mg/dL   Creatinine, Ser 0.52 0.44 - 1.00 mg/dL   Calcium 8.4 (L) 8.9 - 10.3 mg/dL   Total Protein 6.0 (L) 6.5 - 8.1 g/dL   Albumin 2.8 (L) 3.5 - 5.0 g/dL   AST 19 15 - 41 U/L   ALT 133 (H) 14 - 54 U/L   Alkaline Phosphatase 232 (H) 38 - 126 U/L   Total Bilirubin 1.8 (H) 0.3 - 1.2 mg/dL   GFR calc non Af Amer >60 >60 mL/min   GFR calc Af Amer >60 >60 mL/min    Comment: (NOTE) The eGFR has been calculated using the CKD EPI equation. This calculation has not been validated in all clinical situations. eGFR's persistently <60 mL/min signify possible Chronic Kidney Disease.    Anion gap 11 5 - 15  CBC     Status: None   Collection Time: 03/24/17  3:49 AM  Result Value Ref Range   WBC 9.4 4.0 - 10.5 K/uL   RBC 4.24 3.87 - 5.11 MIL/uL   Hemoglobin 13.1 12.0 - 15.0 g/dL   HCT 38.4 36.0 - 46.0 %   MCV 90.6 78.0 - 100.0 fL   MCH 30.9 26.0 - 34.0 pg   MCHC 34.1 30.0 - 36.0 g/dL   RDW 13.3 11.5 - 15.5 %   Platelets 301 150 - 400 K/uL  Protime-INR     Status: None   Collection Time: 03/24/17  3:49 AM  Result Value Ref Range   Prothrombin Time 13.9 11.4 - 15.2 seconds   INR 1.08  Nm Hepatobiliary Liver Func  Result Date: 03/24/2017 CLINICAL DATA:  36 year old female postoperative day 9 coli cystectomy. Abdomen MRI yesterday with increasing perihepatic free fluid suspicious for bile leak. EXAM: NUCLEAR MEDICINE HEPATOBILIARY IMAGING TECHNIQUE: Sequential images of the abdomen were obtained out to 60 minutes following intravenous administration of radiopharmaceutical. RADIOPHARMACEUTICALS:  5  mCi Tc-90m Choletec IV COMPARISON:  Abdomen MRI 03/23/2017 and earlier. FINDINGS: Initially there is prompt radiotracer uptake by the liver and clearance of the blood pool. CBD activity is visible by 20 minutes. Small bowel activity visible by 20 minutes and increases over the course of the study. At the same time there is subtle abnormal radiotracer accumulation occurring along the  inferior right liver tip (Image 13. Progressive radiotracer activity then along the right lateral and superior perihepatic space, such that after 1 hour the more concentrated perihepatic radiotracer activity creates a negative silhouette of the hepatic contour (image 60). This exam was reviewed with Dr. ELeonia Reeves IMPRESSION: 1. Positive for bile leak with perihepatic accumulation of the excreted hepatic radiotracer corresponding to the perihepatic fluid collection on MRI yesterday. This pattern suggests a subcapsular bile leak. 2. The CBD is patent. Electronically Signed   By: HGenevie AnnM.D.   On: 03/24/2017 15:14   Mr Abdomen Mrcp Wo Contrast  Result Date: 03/24/2017 CLINICAL DATA:  Biliary colic status post laparoscopic cholecystectomy 03/15/2017. Concern for cholangitis and choledocholithiasis. EXAM: MRI ABDOMEN WITHOUT CONTRAST  (INCLUDING MRCP) TECHNIQUE: Multiplanar multisequence MR imaging of the abdomen was performed. Heavily T2-weighted images of the biliary and pancreatic ducts were obtained, and three-dimensional MRCP images were rendered by post processing. COMPARISON:  Abdominopelvic CT 03/20/2017. FINDINGS: Lower chest: Progressively lower lung volumes with increasing atelectasis at both lung bases. No significant pleural or pericardial effusion. Hepatobiliary: Mild hepatic steatosis with loss of signal on gradient echo opposed phase images. No focal lesions identified on noncontrast imaging. Examination is mildly motion degraded, especially on the thin section MRCP images. The gallbladder is surgically absent. There is no significant fluid collection of the cholecystectomy bed. There is mild biliary dilatation, improved from CT. The common hepatic duct measures up to 9 mm in diameter and the common bile duct 8 mm. No definite retained calculi are seen on the coronal images. On series 8, tiny distal intraductal calculi (images 38 and 39) at the ampulla are difficult to exclude. The ampullary  prominence on prior CT is not evident. Pancreas: Unremarkable. No pancreatic ductal dilatation or surrounding inflammatory changes. Spleen: Normal in size without focal abnormality. Adrenals/Urinary Tract: Both adrenal glands appear normal. The kidneys appear normal without evidence of urinary tract calculus or hydronephrosis. Bladder not imaged. Stomach/Bowel: No evidence of bowel wall thickening, distention or surrounding inflammatory change. Vascular/Lymphatic: There are no enlarged abdominal lymph nodes. No significant vascular findings. Other: There is increasing ascites which is predominately perihepatic, measuring up to 3.1 cm in thickness. Some fluid extends into the right pericolic gutter. No focal extraluminal collection. Musculoskeletal: No acute or significant osseous findings. IMPRESSION: 1. Compared with the CT of 3 days prior, biliary dilatation has slightly improved. No definite choledocholithiasis. Tiny calculi in the distal common bile duct difficult to completely exclude, but the patient's elevated liver function studies are slowly improving. 2. Increasing perihepatic ascites suspicious for possible biliary leak. Consider nuclear medicine hepatobiliary scan for further evaluation. 3. Mild hepatic steatosis. 4. Progressive atelectasis at both lung bases. Electronically Signed   By: WRichardean SaleM.D.   On: 03/24/2017 09:13  Assessment/Plan  Active Problems:   History of biliary colic   UTI (urinary tract infection)   Abdominal pain   Transaminitis   Cholangitis  Bile Leak s/p lap chole 12/04 - GI plans ERCP Friday - hopefully the stent will work and no surgery will be required.   We will follow along. Thank you for the consult.   Kalman Drape, [facility] 03/24/2017, 4:16 PM Pager: [phone] Consults: [phone] Mon-Fri 7:00 am-4:30 pm Sat-Sun 7:00 am-11:30 am

## 2017-03-25 DIAGNOSIS — Z9049 Acquired absence of other specified parts of digestive tract: Secondary | ICD-10-CM

## 2017-03-25 DIAGNOSIS — R1084 Generalized abdominal pain: Secondary | ICD-10-CM

## 2017-03-25 DIAGNOSIS — R945 Abnormal results of liver function studies: Secondary | ICD-10-CM

## 2017-03-25 LAB — COMPREHENSIVE METABOLIC PANEL
ALT: 91 U/L — ABNORMAL HIGH (ref 14–54)
AST: 17 U/L (ref 15–41)
Albumin: 2.6 g/dL — ABNORMAL LOW (ref 3.5–5.0)
Alkaline Phosphatase: 223 U/L — ABNORMAL HIGH (ref 38–126)
Anion gap: 8 (ref 5–15)
BUN: <5 mg/dL — ABNORMAL LOW (ref 6–20)
CO2: 24 mmol/L (ref 22–32)
Calcium: 8.4 mg/dL — ABNORMAL LOW (ref 8.9–10.3)
Chloride: 106 mmol/L (ref 101–111)
Creatinine, Ser: 0.64 mg/dL (ref 0.44–1.00)
GFR calc Af Amer: >60 mL/min (ref >60)
GFR calc non Af Amer: >60 mL/min (ref >60)
Glucose, Bld: 115 mg/dL — ABNORMAL HIGH (ref 65–99)
Potassium: 3.3 mmol/L — ABNORMAL LOW (ref 3.5–5.1)
Sodium: 138 mmol/L (ref 135–145)
Total Bilirubin: 1.5 mg/dL — ABNORMAL HIGH (ref 0.3–1.2)
Total Protein: 5.4 g/dL — ABNORMAL LOW (ref 6.5–8.1)

## 2017-03-25 LAB — CBC WITH DIFFERENTIAL/PLATELET
Basophils Absolute: 0 10*3/uL (ref 0.0–0.1)
Basophils Relative: 1 %
Eosinophils Absolute: 0.3 10*3/uL (ref 0.0–0.7)
Eosinophils Relative: 4 %
HCT: 37.2 % (ref 36.0–46.0)
Hemoglobin: 12.3 g/dL (ref 12.0–15.0)
Lymphocytes Relative: 22 %
Lymphs Abs: 1.8 10*3/uL (ref 0.7–4.0)
MCH: 30.1 pg (ref 26.0–34.0)
MCHC: 33.1 g/dL (ref 30.0–36.0)
MCV: 91 fL (ref 78.0–100.0)
Monocytes Absolute: 1 10*3/uL (ref 0.1–1.0)
Monocytes Relative: 13 %
Neutro Abs: 5 10*3/uL (ref 1.7–7.7)
Neutrophils Relative %: 60 %
Platelets: 359 10*3/uL (ref 150–400)
RBC: 4.09 MIL/uL (ref 3.87–5.11)
RDW: 13.5 % (ref 11.5–15.5)
WBC: 8.1 10*3/uL (ref 4.0–10.5)

## 2017-03-25 MED ORDER — MELATONIN 3 MG PO TABS
6.0000 mg | ORAL_TABLET | Freq: Every evening | ORAL | Status: DC | PRN
  Filled 2017-03-25: qty 2

## 2017-03-25 MED ORDER — POTASSIUM CHLORIDE 10 MEQ/100ML IV SOLN
10.0000 meq | INTRAVENOUS | Status: AC
  Administered 2017-03-25 (×6): 10 meq via INTRAVENOUS
  Filled 2017-03-25 (×6): qty 100

## 2017-03-25 NOTE — H&P (View-Only) (Signed)
Progress Note   Subjective  Patient has had some periodic RUQ pain overnight, otherwise stable, feeling okay. No other issues overnight. LFTs slightly improved.   Objective   Vital signs in last 24 hours: Temp:  [98.2 F (36.8 C)-98.6 F (37 C)] 98.2 F (36.8 C) (12/13 0535) Pulse Rate:  [76-99] 76 (12/13 0535) Resp:  [18] 18 (12/13 0535) BP: (124-129)/(68-79) 124/68 (12/13 0535) SpO2:  [96 %-100 %] 96 % (12/13 0535) Weight:  [208 lb 15.9 oz (94.8 kg)] 208 lb 15.9 oz (94.8 kg) (12/13 0745)   General:    white female in NAD Heart:  Regular rate and rhythm;  Lungs: Respirations even and unlabored, lungs CTA bilaterally Abdomen:  Soft, mild RUQ TTP and nondistended.  Extremities:  Without edema. Neurologic:  Alert and oriented,  grossly normal neurologically. Psych:  Cooperative. Normal mood and affect.  Intake/Output from previous day: 12/12 0701 - 12/13 0700 In: 2625 [I.V.:2275; IV Piggyback:350] Out: 1400 [Urine:1400] Intake/Output this shift: No intake/output data recorded.  Lab Results: Recent Labs    03/23/17 1134 03/24/17 0349  WBC 11.3* 9.4  HGB 13.1 13.1  HCT 40.2 38.4  PLT 274 301   BMET Recent Labs    03/23/17 1134 03/24/17 0349 03/25/17 0635  NA 138 135 138  K 3.7 3.3* 3.3*  CL 109 106 106  CO2 18* 18* 24  GLUCOSE 75 97 115*  BUN <5* <5* <5*  CREATININE 0.60 0.52 0.64  CALCIUM 8.7* 8.4* 8.4*   LFT Recent Labs    03/25/17 0635  PROT 5.4*  ALBUMIN 2.6*  AST 17  ALT 91*  ALKPHOS 223*  BILITOT 1.5*   PT/INR Recent Labs    03/23/17 1134 03/24/17 0349  LABPROT 14.4 13.9  INR 1.13 1.08    Studies/Results: Nm Hepatobiliary Liver Func  Result Date: 03/24/2017 CLINICAL DATA:  36 year old female postoperative day 9 coli cystectomy. Abdomen MRI yesterday with increasing perihepatic free fluid suspicious for bile leak. EXAM: NUCLEAR MEDICINE HEPATOBILIARY IMAGING TECHNIQUE: Sequential images of the abdomen were obtained out to 60  minutes following intravenous administration of radiopharmaceutical. RADIOPHARMACEUTICALS:  5  mCi Tc-1838m  Choletec IV COMPARISON:  Abdomen MRI 03/23/2017 and earlier. FINDINGS: Initially there is prompt radiotracer uptake by the liver and clearance of the blood pool. CBD activity is visible by 20 minutes. Small bowel activity visible by 20 minutes and increases over the course of the study. At the same time there is subtle abnormal radiotracer accumulation occurring along the inferior right liver tip (Image 13. Progressive radiotracer activity then along the right lateral and superior perihepatic space, such that after 1 hour the more concentrated perihepatic radiotracer activity creates a negative silhouette of the hepatic contour (image 60). This exam was reviewed with Dr. Amil AmenEdmunds. IMPRESSION: 1. Positive for bile leak with perihepatic accumulation of the excreted hepatic radiotracer corresponding to the perihepatic fluid collection on MRI yesterday. This pattern suggests a subcapsular bile leak. 2. The CBD is patent. Electronically Signed   By: Odessa FlemingH  Hall M.D.   On: 03/24/2017 15:14   Mr Abdomen Mrcp Wo Contrast  Result Date: 03/24/2017 CLINICAL DATA:  Biliary colic status post laparoscopic cholecystectomy 03/15/2017. Concern for cholangitis and choledocholithiasis. EXAM: MRI ABDOMEN WITHOUT CONTRAST  (INCLUDING MRCP) TECHNIQUE: Multiplanar multisequence MR imaging of the abdomen was performed. Heavily T2-weighted images of the biliary and pancreatic ducts were obtained, and three-dimensional MRCP images were rendered by post processing. COMPARISON:  Abdominopelvic CT 03/20/2017. FINDINGS: Lower chest: Progressively lower  lung volumes with increasing atelectasis at both lung bases. No significant pleural or pericardial effusion. Hepatobiliary: Mild hepatic steatosis with loss of signal on gradient echo opposed phase images. No focal lesions identified on noncontrast imaging. Examination is mildly motion  degraded, especially on the thin section MRCP images. The gallbladder is surgically absent. There is no significant fluid collection of the cholecystectomy bed. There is mild biliary dilatation, improved from CT. The common hepatic duct measures up to 9 mm in diameter and the common bile duct 8 mm. No definite retained calculi are seen on the coronal images. On series 8, tiny distal intraductal calculi (images 38 and 39) at the ampulla are difficult to exclude. The ampullary prominence on prior CT is not evident. Pancreas: Unremarkable. No pancreatic ductal dilatation or surrounding inflammatory changes. Spleen: Normal in size without focal abnormality. Adrenals/Urinary Tract: Both adrenal glands appear normal. The kidneys appear normal without evidence of urinary tract calculus or hydronephrosis. Bladder not imaged. Stomach/Bowel: No evidence of bowel wall thickening, distention or surrounding inflammatory change. Vascular/Lymphatic: There are no enlarged abdominal lymph nodes. No significant vascular findings. Other: There is increasing ascites which is predominately perihepatic, measuring up to 3.1 cm in thickness. Some fluid extends into the right pericolic gutter. No focal extraluminal collection. Musculoskeletal: No acute or significant osseous findings. IMPRESSION: 1. Compared with the CT of 3 days prior, biliary dilatation has slightly improved. No definite choledocholithiasis. Tiny calculi in the distal common bile duct difficult to completely exclude, but the patient's elevated liver function studies are slowly improving. 2. Increasing perihepatic ascites suspicious for possible biliary leak. Consider nuclear medicine hepatobiliary scan for further evaluation. 3. Mild hepatic steatosis. 4. Progressive atelectasis at both lung bases. Electronically Signed   By: Carey BullocksWilliam  Veazey M.D.   On: 03/24/2017 09:13       Assessment / Plan:   36 y/o female who is s/p lap chole on 12/3, who presented with  worsening abdominal pain and elevation in liver enzymes. MRCP showed perihepatic fluid, CBD 8mm, unable to exclude small stones. HIDA scan shows patent CBD, no evidence of retained stone or biliary obstruction, however a subcapsular bile leak is noted. Suspect leak from duct of [place] / gallbladder bed of the liver.   She continues to have some pain. LFTs are stable. She warrants an ERCP to stent the bile duct as primary therapy, this is coordinated for tomorrow. I have spoken with our surgeons and her primary surgeon, Dr. Darla LeschesBlazek, called in yesterday to discuss her case. In the interim continue supportive care and antibiotics. I have discussed risks / benefits of ERCP with the patient, she verbalized understanding and wishes to proceed. Her case will be done by Dr. Myrtie Neitheranis who is covering advanced endoscopy service this week.   Ileene PatrickSteven Armbruster, MD [facility] Pager [phone]

## 2017-03-25 NOTE — Progress Notes (Signed)
    Progress Note   Subjective  Patient has had some periodic RUQ pain overnight, otherwise stable, feeling okay. No other issues overnight. LFTs slightly improved.   Objective   Vital signs in last 24 hours: Temp:  [98.2 F (36.8 C)-98.6 F (37 C)] 98.2 F (36.8 C) (12/13 0535) Pulse Rate:  [76-99] 76 (12/13 0535) Resp:  [18] 18 (12/13 0535) BP: (124-129)/(68-79) 124/68 (12/13 0535) SpO2:  [96 %-100 %] 96 % (12/13 0535) Weight:  [208 lb 15.9 oz (94.8 kg)] 208 lb 15.9 oz (94.8 kg) (12/13 0745)   General:    white female in NAD Heart:  Regular rate and rhythm;  Lungs: Respirations even and unlabored, lungs CTA bilaterally Abdomen:  Soft, mild RUQ TTP and nondistended.  Extremities:  Without edema. Neurologic:  Alert and oriented,  grossly normal neurologically. Psych:  Cooperative. Normal mood and affect.  Intake/Output from previous day: 12/12 0701 - 12/13 0700 In: 2625 [I.V.:2275; IV Piggyback:350] Out: 1400 [Urine:1400] Intake/Output this shift: No intake/output data recorded.  Lab Results: Recent Labs    03/23/17 1134 03/24/17 0349  WBC 11.3* 9.4  HGB 13.1 13.1  HCT 40.2 38.4  PLT 274 301   BMET Recent Labs    03/23/17 1134 03/24/17 0349 03/25/17 0635  NA 138 135 138  K 3.7 3.3* 3.3*  CL 109 106 106  CO2 18* 18* 24  GLUCOSE 75 97 115*  BUN <5* <5* <5*  CREATININE 0.60 0.52 0.64  CALCIUM 8.7* 8.4* 8.4*   LFT Recent Labs    03/25/17 0635  PROT 5.4*  ALBUMIN 2.6*  AST 17  ALT 91*  ALKPHOS 223*  BILITOT 1.5*   PT/INR Recent Labs    03/23/17 1134 03/24/17 0349  LABPROT 14.4 13.9  INR 1.13 1.08    Studies/Results: Nm Hepatobiliary Liver Func  Result Date: 03/24/2017 CLINICAL DATA:  36-year-old female postoperative day 9 coli cystectomy. Abdomen MRI yesterday with increasing perihepatic free fluid suspicious for bile leak. EXAM: NUCLEAR MEDICINE HEPATOBILIARY IMAGING TECHNIQUE: Sequential images of the abdomen were obtained out to 60  minutes following intravenous administration of radiopharmaceutical. RADIOPHARMACEUTICALS:  5  mCi Tc-99m  Choletec IV COMPARISON:  Abdomen MRI 03/23/2017 and earlier. FINDINGS: Initially there is prompt radiotracer uptake by the liver and clearance of the blood pool. CBD activity is visible by 20 minutes. Small bowel activity visible by 20 minutes and increases over the course of the study. At the same time there is subtle abnormal radiotracer accumulation occurring along the inferior right liver tip (Image 13. Progressive radiotracer activity then along the right lateral and superior perihepatic space, such that after 1 hour the more concentrated perihepatic radiotracer activity creates a negative silhouette of the hepatic contour (image 60). This exam was reviewed with Dr. Edmunds. IMPRESSION: 1. Positive for bile leak with perihepatic accumulation of the excreted hepatic radiotracer corresponding to the perihepatic fluid collection on MRI yesterday. This pattern suggests a subcapsular bile leak. 2. The CBD is patent. Electronically Signed   By: H  Hall M.D.   On: 03/24/2017 15:14   Mr Abdomen Mrcp Wo Contrast  Result Date: 03/24/2017 CLINICAL DATA:  Biliary colic status post laparoscopic cholecystectomy 03/15/2017. Concern for cholangitis and choledocholithiasis. EXAM: MRI ABDOMEN WITHOUT CONTRAST  (INCLUDING MRCP) TECHNIQUE: Multiplanar multisequence MR imaging of the abdomen was performed. Heavily T2-weighted images of the biliary and pancreatic ducts were obtained, and three-dimensional MRCP images were rendered by post processing. COMPARISON:  Abdominopelvic CT 03/20/2017. FINDINGS: Lower chest: Progressively lower   lung volumes with increasing atelectasis at both lung bases. No significant pleural or pericardial effusion. Hepatobiliary: Mild hepatic steatosis with loss of signal on gradient echo opposed phase images. No focal lesions identified on noncontrast imaging. Examination is mildly motion  degraded, especially on the thin section MRCP images. The gallbladder is surgically absent. There is no significant fluid collection of the cholecystectomy bed. There is mild biliary dilatation, improved from CT. The common hepatic duct measures up to 9 mm in diameter and the common bile duct 8 mm. No definite retained calculi are seen on the coronal images. On series 8, tiny distal intraductal calculi (images 38 and 39) at the ampulla are difficult to exclude. The ampullary prominence on prior CT is not evident. Pancreas: Unremarkable. No pancreatic ductal dilatation or surrounding inflammatory changes. Spleen: Normal in size without focal abnormality. Adrenals/Urinary Tract: Both adrenal glands appear normal. The kidneys appear normal without evidence of urinary tract calculus or hydronephrosis. Bladder not imaged. Stomach/Bowel: No evidence of bowel wall thickening, distention or surrounding inflammatory change. Vascular/Lymphatic: There are no enlarged abdominal lymph nodes. No significant vascular findings. Other: There is increasing ascites which is predominately perihepatic, measuring up to 3.1 cm in thickness. Some fluid extends into the right pericolic gutter. No focal extraluminal collection. Musculoskeletal: No acute or significant osseous findings. IMPRESSION: 1. Compared with the CT of 3 days prior, biliary dilatation has slightly improved. No definite choledocholithiasis. Tiny calculi in the distal common bile duct difficult to completely exclude, but the patient's elevated liver function studies are slowly improving. 2. Increasing perihepatic ascites suspicious for possible biliary leak. Consider nuclear medicine hepatobiliary scan for further evaluation. 3. Mild hepatic steatosis. 4. Progressive atelectasis at both lung bases. Electronically Signed   By: Carey BullocksWilliam  Veazey M.D.   On: 03/24/2017 09:13       Assessment / Plan:   36 y/o female who is s/p lap chole on 12/3, who presented with  worsening abdominal pain and elevation in liver enzymes. MRCP showed perihepatic fluid, CBD 8mm, unable to exclude small stones. HIDA scan shows patent CBD, no evidence of retained stone or biliary obstruction, however a subcapsular bile leak is noted. Suspect leak from duct of Luschka / gallbladder bed of the liver.   She continues to have some pain. LFTs are stable. She warrants an ERCP to stent the bile duct as primary therapy, this is coordinated for tomorrow. I have spoken with our surgeons and her primary surgeon, Dr. Darla LeschesBlazek, called in yesterday to discuss her case. In the interim continue supportive care and antibiotics. I have discussed risks / benefits of ERCP with the patient, she verbalized understanding and wishes to proceed. Her case will be done by Dr. Myrtie Neitheranis who is covering advanced endoscopy service this week.   Ileene PatrickSteven Armbruster, MD [facility] Pager [phone]

## 2017-03-25 NOTE — Progress Notes (Signed)
[facility] Progress Note      Assessment/Plan Active Problems:   History of biliary colic   UTI (urinary tract infection)   Abdominal pain   Transaminitis   Cholangitis  Bile Leak s/p lap chole 12/04 - GI plans ERCP Friday - hopefully the stent will work and no surgery will be required.  Port incision in epigastric region - mild exudate from under scab, applied dressing, will monitor, daily dressing changes  We will follow along   LOS: 2 days    Subjective: CC; abdomina pain, puss from upper port incisions  Mild abdominal pain today. Well controlled. Mild nausea no vomiting. Concerned about upper port incision.   Objective: Vital signs in last 24 hours: Temp:  [98.2 F (36.8 C)-98.6 F (37 C)] 98.2 F (36.8 C) (12/13 0535) Pulse Rate:  [76-99] 76 (12/13 0535) Resp:  [18] 18 (12/13 0535) BP: (124-129)/(68-79) 124/68 (12/13 0535) SpO2:  [96 %-100 %] 96 % (12/13 0535) Weight:  [208 lb 15.9 oz (94.8 kg)] 208 lb 15.9 oz (94.8 kg) (12/13 0745)    Intake/Output from previous day: 12/12 0701 - 12/13 0700 In: 2625 [I.V.:2275; IV Piggyback:350] Out: 1400 [Urine:1400] Intake/Output this shift: No intake/output data recorded.  PE: Gen:  Alert, NAD, pleasant, cooperative Pulm:  Rate and effort normal Abd: Soft, not distended, +BS, mild RUQ TTP without guarding, port site with mild exudate when scab removed, cleaned wound and dressing applied. Subcutaneous sutures intact Skin: no rashes noted, warm and dry      Anti-infectives: Anti-infectives (From admission, onward)   Start     Dose/Rate Route Frequency Ordered Stop   03/23/17 1130  metroNIDAZOLE (FLAGYL) IVPB 500 mg     500 mg 100 mL/hr over 60 Minutes Intravenous Every 8 hours 03/23/17 1109     03/23/17 1130  cefTRIAXone (ROCEPHIN) 2 g in dextrose 5 % 50 mL IVPB     2 g 100 mL/hr over 30 Minutes Intravenous Every 24 hours 03/23/17 1112        Lab Results:  Recent Labs     03/24/17 0349 03/25/17 0635  WBC 9.4 8.1  HGB 13.1 12.3  HCT 38.4 37.2  PLT 301 359   BMET Recent Labs    03/24/17 0349 03/25/17 0635  NA 135 138  K 3.3* 3.3*  CL 106 106  CO2 18* 24  GLUCOSE 97 115*  BUN <5* <5*  CREATININE 0.52 0.64  CALCIUM 8.4* 8.4*   PT/INR Recent Labs    03/23/17 1134 03/24/17 0349  LABPROT 14.4 13.9  INR 1.13 1.08   CMP     Component Value Date/Time   NA 138 03/25/2017 0635   K 3.3 (L) 03/25/2017 0635   CL 106 03/25/2017 0635   CO2 24 03/25/2017 0635   GLUCOSE 115 (H) 03/25/2017 0635   BUN <5 (L) 03/25/2017 0635   CREATININE 0.64 03/25/2017 0635   CALCIUM 8.4 (L) 03/25/2017 0635   PROT 5.4 (L) 03/25/2017 0635   ALBUMIN 2.6 (L) 03/25/2017 0635   AST 17 03/25/2017 0635   ALT 91 (H) 03/25/2017 0635   ALKPHOS 223 (H) 03/25/2017 0635   BILITOT 1.5 (H) 03/25/2017 0635   GFRNONAA >60 03/25/2017 0635   GFRAA >60 03/25/2017 0635   Lipase  No results found for: LIPASE  Studies/Results: Nm Hepatobiliary Liver Func  Result Date: 03/24/2017 CLINICAL DATA:  36 year old female postoperative day 9 coli cystectomy. Abdomen MRI yesterday with increasing perihepatic free fluid suspicious for bile leak. EXAM: NUCLEAR MEDICINE  HEPATOBILIARY IMAGING TECHNIQUE: Sequential images of the abdomen were obtained out to 60 minutes following intravenous administration of radiopharmaceutical. RADIOPHARMACEUTICALS:  5  mCi Tc-5271m  Choletec IV COMPARISON:  Abdomen MRI 03/23/2017 and earlier. FINDINGS: Initially there is prompt radiotracer uptake by the liver and clearance of the blood pool. CBD activity is visible by 20 minutes. Small bowel activity visible by 20 minutes and increases over the course of the study. At the same time there is subtle abnormal radiotracer accumulation occurring along the inferior right liver tip (Image 13. Progressive radiotracer activity then along the right lateral and superior perihepatic space, such that after 1 hour the more  concentrated perihepatic radiotracer activity creates a negative silhouette of the hepatic contour (image 60). This exam was reviewed with Dr. Amil AmenEdmunds. IMPRESSION: 1. Positive for bile leak with perihepatic accumulation of the excreted hepatic radiotracer corresponding to the perihepatic fluid collection on MRI yesterday. This pattern suggests a subcapsular bile leak. 2. The CBD is patent. Electronically Signed   By: Odessa FlemingH  Hall M.D.   On: 03/24/2017 15:14   Mr Abdomen Mrcp Wo Contrast  Result Date: 03/24/2017 CLINICAL DATA:  Biliary colic status post laparoscopic cholecystectomy 03/15/2017. Concern for cholangitis and choledocholithiasis. EXAM: MRI ABDOMEN WITHOUT CONTRAST  (INCLUDING MRCP) TECHNIQUE: Multiplanar multisequence MR imaging of the abdomen was performed. Heavily T2-weighted images of the biliary and pancreatic ducts were obtained, and three-dimensional MRCP images were rendered by post processing. COMPARISON:  Abdominopelvic CT 03/20/2017. FINDINGS: Lower chest: Progressively lower lung volumes with increasing atelectasis at both lung bases. No significant pleural or pericardial effusion. Hepatobiliary: Mild hepatic steatosis with loss of signal on gradient echo opposed phase images. No focal lesions identified on noncontrast imaging. Examination is mildly motion degraded, especially on the thin section MRCP images. The gallbladder is surgically absent. There is no significant fluid collection of the cholecystectomy bed. There is mild biliary dilatation, improved from CT. The common hepatic duct measures up to 9 mm in diameter and the common bile duct 8 mm. No definite retained calculi are seen on the coronal images. On series 8, tiny distal intraductal calculi (images 38 and 39) at the ampulla are difficult to exclude. The ampullary prominence on prior CT is not evident. Pancreas: Unremarkable. No pancreatic ductal dilatation or surrounding inflammatory changes. Spleen: Normal in size without focal  abnormality. Adrenals/Urinary Tract: Both adrenal glands appear normal. The kidneys appear normal without evidence of urinary tract calculus or hydronephrosis. Bladder not imaged. Stomach/Bowel: No evidence of bowel wall thickening, distention or surrounding inflammatory change. Vascular/Lymphatic: There are no enlarged abdominal lymph nodes. No significant vascular findings. Other: There is increasing ascites which is predominately perihepatic, measuring up to 3.1 cm in thickness. Some fluid extends into the right pericolic gutter. No focal extraluminal collection. Musculoskeletal: No acute or significant osseous findings. IMPRESSION: 1. Compared with the CT of 3 days prior, biliary dilatation has slightly improved. No definite choledocholithiasis. Tiny calculi in the distal common bile duct difficult to completely exclude, but the patient's elevated liver function studies are slowly improving. 2. Increasing perihepatic ascites suspicious for possible biliary leak. Consider nuclear medicine hepatobiliary scan for further evaluation. 3. Mild hepatic steatosis. 4. Progressive atelectasis at both lung bases. Electronically Signed   By: Carey BullocksWilliam  Veazey M.D.   On: 03/24/2017 09:13      Jerre SimonJessica L Jacqualin Shirkey , [facility] 03/25/2017, 10:57 AM Pager: [phone] Consults: [phone] Mon-Fri 7:00 am-4:30 pm Sat-Sun 7:00 am-11:30 am

## 2017-03-25 NOTE — Progress Notes (Signed)
PROGRESS NOTE   Brenda LeveringJennifer Luna  UYQ:[id number]    DOB: 01/01/1981    DOA: 03/23/2017  PCP: Kendra Opitzole, Dawn Watkins, NP   I have briefly reviewed patients previous medical [facility].  Brief Narrative:  36 year old female patient with PMH of PCOS underwent elective laparoscopic [facility] on [facility], did well for the first couple days then noted progressively worsening abdominal pain and [facility] abdomen there showed possible retained stone, possible cholangitis but no pancreatitis. MRCP or ERCP were recommended but not available at [facility] and [facility]. MRCP >HIDA scan confirmed biliary leak.  GI and CCS consulted. Tentatively plan for ERCP to stent bile duct on 02/24/17.   Assessment & Plan:   Active Problems:   History of biliary colic   UTI (urinary tract infection)   Abdominal pain   Transaminitis   Cholangitis   1. Bile leak status post laparoscopic cholecystectomy 12/4: MRCP results appreciated. Consulted and discussed with Dr. Adela LankArmbruster, [facility] who recommended HIDA scan. HIDA scan confirms bile leak. As per GI, ERCP and bile duct stenting on 02/24/17, continue nothing by mouth and IV antibiotics. General surgery on board and hopeful that the stent will work and no surgery will be required. Improving. Pain is better, LFTs continued to improve. Requested surgery to evaluate minimal pus extruding from epigastric laparoscopic incision site. 2. Hypokalemia: Replace IV and follow. Check magnesium in a.m. 3. History of PCOS    DVT prophylaxis: SCD's Code Status: Full Family Communication: None at bedside Disposition: DC home when medically improved.   Consultants:  [facility] General surgery   Procedures:  None  Antimicrobials:  IV ceftriaxone and metronidazole    Subjective: Feels much better today. Less pain. No nausea or vomiting.  ROS: As  above  Objective:  Vitals:   03/24/17 2225 03/25/17 0535 03/25/17 0745 03/25/17 1259  BP: 129/79 124/68  113/69  Pulse: 99 76  76  Resp: 18 18  17   Temp: 98.6 F (37 C) 98.2 F (36.8 C)  98.2 F (36.8 C)  TempSrc: Oral   Oral  SpO2: 100% 96%  99%  Weight:   94.8 kg (208 lb 15.9 oz)   Height:        Examination:  General exam: Pleasant young female seen ambulating comfortably in the hallway. Respiratory system: Clear to auscultation. Respiratory effort normal. Stable without change. Cardiovascular system: S1 & S2 heard, RRR. No JVD, murmurs, rubs, gallops or clicks. No pedal edema. Stable without change. Gastrointestinal system: Abdomen is nondistended, soft. Minimal tenderness in RUQ without peritoneal signs. Epigastric laparoscopic incision site with minimal pus extrusion. Other sites look okay. Central nervous system: Alert and oriented. No focal neurological deficits. Stable. Extremities: Symmetric 5 x 5 power. Skin: No rashes, lesions or ulcers Psychiatry: Judgement and insight appear normal. Mood & affect appropriate.     Data Reviewed: I have personally reviewed following labs and imaging studies  CBC: Recent Labs  Lab 03/23/17 1134 03/24/17 0349 03/25/17 0635  WBC 11.3* 9.4 8.1  NEUTROABS 8.7*  --  5.0  HGB 13.1 13.1 12.3  HCT 40.2 38.4 37.2  MCV 92.6 90.6 91.0  PLT 274 301 359   Basic Metabolic Panel: Recent Labs  Lab 03/23/17 1134 03/24/17 0349 03/25/17 0635  NA 138 135 138  K 3.7 3.3* 3.3*  CL 109 106 106  CO2 18* 18* 24  GLUCOSE 75 97 115*  BUN <5* <5* <5*  CREATININE 0.60 0.52 0.64  CALCIUM 8.7* 8.4* 8.4*   Liver Function Tests: Recent Labs  Lab 03/23/17 1134 03/24/17 0349 03/25/17 0635  AST 28 19 17   ALT 184* 133* 91*  ALKPHOS 288* 232* 223*  BILITOT 2.3* 1.8* 1.5*  PROT 6.4* 6.0* 5.4*  ALBUMIN 3.2* 2.8* 2.6*   Coagulation Profile: Recent Labs  Lab 03/23/17 1134 03/24/17 0349  INR 1.13 1.08   HbA1C: Recent Labs     03/23/17 1124  HGBA1C 4.9   CBG: Recent Labs  Lab 03/23/17 1345  GLUCAP 78    No results found for this or any previous visit (from the past 240 hour(s)).       Radiology Studies: Nm Hepatobiliary Liver Func  Result Date: 03/24/2017 CLINICAL DATA:  36 year old female postoperative day 9 coli cystectomy. Abdomen MRI yesterday with increasing perihepatic free fluid suspicious for bile leak. EXAM: NUCLEAR MEDICINE HEPATOBILIARY IMAGING TECHNIQUE: Sequential images of the abdomen were obtained out to 60 minutes following intravenous administration of radiopharmaceutical. RADIOPHARMACEUTICALS:  5  mCi Tc-5221m  Choletec IV COMPARISON:  Abdomen MRI 03/23/2017 and earlier. FINDINGS: Initially there is prompt radiotracer uptake by the liver and clearance of the blood pool. CBD activity is visible by 20 minutes. Small bowel activity visible by 20 minutes and increases over the course of the study. At the same time there is subtle abnormal radiotracer accumulation occurring along the inferior right liver tip (Image 13. Progressive radiotracer activity then along the right lateral and superior perihepatic space, such that after 1 hour the more concentrated perihepatic radiotracer activity creates a negative silhouette of the hepatic contour (image 60). This exam was reviewed with Dr. Amil AmenEdmunds. IMPRESSION: 1. Positive for bile leak with perihepatic accumulation of the excreted hepatic radiotracer corresponding to the perihepatic fluid collection on MRI yesterday. This pattern suggests a subcapsular bile leak. 2. The CBD is patent. Electronically Signed   By: Odessa FlemingH  Hall M.D.   On: 03/24/2017 15:14        Scheduled Meds:  Continuous Infusions: . cefTRIAXone (ROCEPHIN)  IV Stopped (03/25/17 1201)  . dextrose 5 % and 0.45% NaCl 125 mL/hr at 03/25/17 0231  . metronidazole Stopped (03/25/17 1310)     LOS: 2 days     Marcellus ScottAnand Rheya Minogue, MD, FACP, [facility]. [facility] Pager [phone]  If 7PM-7AM,  please contact night-coverage [email or web address] Password [email or web address] 03/25/2017, 5:13 PM

## 2017-03-26 ENCOUNTER — Inpatient Hospital Stay (HOSPITAL_COMMUNITY): Payer: BLUE CROSS/BLUE SHIELD

## 2017-03-26 ENCOUNTER — Encounter (HOSPITAL_COMMUNITY)
Admission: AD | Disposition: A | Discharge status: Home / Self Care | Payer: Self-pay | Source: Other Acute Inpatient Hospital | Attending: Internal Medicine

## 2017-03-26 ENCOUNTER — Encounter (HOSPITAL_COMMUNITY): Payer: Self-pay | Admitting: *Deleted

## 2017-03-26 ENCOUNTER — Inpatient Hospital Stay (HOSPITAL_COMMUNITY): Payer: BLUE CROSS/BLUE SHIELD | Admitting: Anesthesiology

## 2017-03-26 DIAGNOSIS — K8050 Calculus of bile duct without cholangitis or cholecystitis without obstruction: Secondary | ICD-10-CM

## 2017-03-26 DIAGNOSIS — K838 Other specified diseases of biliary tract: Secondary | ICD-10-CM

## 2017-03-26 DIAGNOSIS — K839 Disease of biliary tract, unspecified: Secondary | ICD-10-CM

## 2017-03-26 HISTORY — PX: ERCP: SHX5425

## 2017-03-26 LAB — MAGNESIUM: Magnesium: 1.6 mg/dL — ABNORMAL LOW (ref 1.7–2.4)

## 2017-03-26 LAB — COMPREHENSIVE METABOLIC PANEL
ALT: 74 U/L — ABNORMAL HIGH (ref 14–54)
AST: 14 U/L — ABNORMAL LOW (ref 15–41)
Albumin: 2.7 g/dL — ABNORMAL LOW (ref 3.5–5.0)
Alkaline Phosphatase: 219 U/L — ABNORMAL HIGH (ref 38–126)
Anion gap: 9 (ref 5–15)
BUN: <5 mg/dL — ABNORMAL LOW (ref 6–20)
CO2: 24 mmol/L (ref 22–32)
Calcium: 8.7 mg/dL — ABNORMAL LOW (ref 8.9–10.3)
Chloride: 105 mmol/L (ref 101–111)
Creatinine, Ser: 0.64 mg/dL (ref 0.44–1.00)
GFR calc Af Amer: >60 mL/min (ref >60)
GFR calc non Af Amer: >60 mL/min (ref >60)
Glucose, Bld: 115 mg/dL — ABNORMAL HIGH (ref 65–99)
Potassium: 3.7 mmol/L (ref 3.5–5.1)
Sodium: 138 mmol/L (ref 135–145)
Total Bilirubin: 1.5 mg/dL — ABNORMAL HIGH (ref 0.3–1.2)
Total Protein: 5.7 g/dL — ABNORMAL LOW (ref 6.5–8.1)

## 2017-03-26 SURGERY — ERCP, WITH INTERVENTION IF INDICATED
Anesthesia: General

## 2017-03-26 MED ORDER — SUGAMMADEX SODIUM 200 MG/2ML IV SOLN
INTRAVENOUS | Status: DC | PRN
  Administered 2017-03-26: 195 mg via INTRAVENOUS

## 2017-03-26 MED ORDER — IOPAMIDOL (ISOVUE-300) INJECTION 61%
INTRAVENOUS | Status: AC
  Administered 2017-03-26: 75 mL
  Filled 2017-03-26: qty 75

## 2017-03-26 MED ORDER — FENTANYL CITRATE (PF) 100 MCG/2ML IJ SOLN: 25.0000 ug | INTRAMUSCULAR | Status: DC | PRN

## 2017-03-26 MED ORDER — INDOMETHACIN 50 MG RE SUPP
100.0000 mg | Freq: Once | RECTAL | Status: AC
  Filled 2017-03-26: qty 2

## 2017-03-26 MED ORDER — MEPERIDINE HCL 100 MG/ML IJ SOLN: 6.2500 mg | INTRAMUSCULAR | Status: DC | PRN

## 2017-03-26 MED ORDER — INDOMETHACIN 50 MG RE SUPP
RECTAL | Status: AC
  Filled 2017-03-26: qty 2

## 2017-03-26 MED ORDER — FENTANYL CITRATE (PF) 100 MCG/2ML IJ SOLN
INTRAMUSCULAR | Status: DC | PRN
  Administered 2017-03-26 (×2): 100 ug via INTRAVENOUS

## 2017-03-26 MED ORDER — PROMETHAZINE HCL 25 MG/ML IJ SOLN: 6.2500 mg | INTRAMUSCULAR | Status: DC | PRN

## 2017-03-26 MED ORDER — GLUCAGON HCL RDNA (DIAGNOSTIC) 1 MG IJ SOLR
INTRAMUSCULAR | Status: DC | PRN
  Administered 2017-03-26: .5 mg via INTRAVENOUS

## 2017-03-26 MED ORDER — KETOROLAC TROMETHAMINE 30 MG/ML IJ SOLN: 30.0000 mg | Freq: Once | INTRAMUSCULAR | Status: DC | PRN

## 2017-03-26 MED ORDER — IOPAMIDOL (ISOVUE-300) INJECTION 61%
INTRAVENOUS | Status: AC
  Filled 2017-03-26: qty 50

## 2017-03-26 MED ORDER — DEXAMETHASONE SODIUM PHOSPHATE 10 MG/ML IJ SOLN
INTRAMUSCULAR | Status: DC | PRN
  Administered 2017-03-26: 10 mg via INTRAVENOUS

## 2017-03-26 MED ORDER — LACTATED RINGERS IV SOLN
INTRAVENOUS | Status: DC | PRN
  Administered 2017-03-26 (×2): via INTRAVENOUS

## 2017-03-26 MED ORDER — GLUCAGON HCL RDNA (DIAGNOSTIC) 1 MG IJ SOLR
INTRAMUSCULAR | Status: AC
  Filled 2017-03-26: qty 1

## 2017-03-26 MED ORDER — INDOMETHACIN 50 MG RE SUPP
RECTAL | Status: DC | PRN
  Administered 2017-03-26: 100 mg via RECTAL

## 2017-03-26 MED ORDER — PROPOFOL 10 MG/ML IV BOLUS
INTRAVENOUS | Status: DC | PRN
  Administered 2017-03-26: 200 mg via INTRAVENOUS

## 2017-03-26 MED ORDER — SODIUM CHLORIDE 0.9 % IV SOLN
INTRAVENOUS | Status: DC | PRN
  Administered 2017-03-26: 50 mL

## 2017-03-26 MED ORDER — LACTATED RINGERS IV SOLN
INTRAVENOUS | Status: AC | PRN
  Administered 2017-03-26: 1000 mL via INTRAVENOUS

## 2017-03-26 MED ORDER — LIDOCAINE HCL (CARDIAC) 20 MG/ML IV SOLN
INTRAVENOUS | Status: DC | PRN
  Administered 2017-03-26: 30 mg via INTRAVENOUS

## 2017-03-26 MED ORDER — MIDAZOLAM HCL 5 MG/5ML IJ SOLN
INTRAMUSCULAR | Status: DC | PRN
  Administered 2017-03-26: 2 mg via INTRAVENOUS

## 2017-03-26 MED ORDER — MAGNESIUM SULFATE 2 GM/50ML IV SOLN
2.0000 g | Freq: Once | INTRAVENOUS | Status: AC
  Administered 2017-03-26: 2 g via INTRAVENOUS
  Filled 2017-03-26 (×2): qty 50

## 2017-03-26 MED ORDER — ROCURONIUM BROMIDE 100 MG/10ML IV SOLN
INTRAVENOUS | Status: DC | PRN
  Administered 2017-03-26: 10 mg via INTRAVENOUS
  Administered 2017-03-26: 50 mg via INTRAVENOUS

## 2017-03-26 MED FILL — Ondansetron HCl Inj 4 MG/2ML (2 MG/ML): INTRAMUSCULAR | Qty: 2 | Status: AC

## 2017-03-26 NOTE — Anesthesia Postprocedure Evaluation (Signed)
Anesthesia Post Note  Patient: Brenda LeveringJennifer Luna  Procedure(s) Performed: ENDOSCOPIC RETROGRADE CHOLANGIOPANCREATOGRAPHY (ERCP) (N/A )     Patient location during evaluation: Endoscopy Anesthesia Type: General Level of consciousness: awake and sedated Pain management: pain level controlled Vital Signs Assessment: post-procedure vital signs reviewed and stable Respiratory status: spontaneous breathing Postop Assessment: no apparent nausea or vomiting Anesthetic complications: no    Last Vitals:  Vitals:   03/26/17 1110 03/26/17 1120  BP: (!) 144/86 137/78  Pulse: (!) 102 92  Resp: 16 14  Temp:    SpO2: 94% 94%    Last Pain:  Vitals:   03/26/17 1103  TempSrc: Oral  PainSc:    Pain Goal: Patients Stated Pain Goal: 5 (03/26/17 0854)               Alyria Krack JR,JOHN Susann GivensFRANKLIN

## 2017-03-26 NOTE — Transfer of Care (Signed)
Immediate Anesthesia Transfer of Care Note  Patient: Brenda LeveringJennifer Abee  Procedure(s) Performed: ENDOSCOPIC RETROGRADE CHOLANGIOPANCREATOGRAPHY (ERCP) (N/A )  Patient Location: Endoscopy Unit  Anesthesia Type:General  Level of Consciousness: awake, alert  and oriented  Airway & Oxygen Therapy: Patient Spontanous Breathing and Patient connected to nasal cannula oxygen  Post-op Assessment: Report given to RN and Post -op Vital signs reviewed and stable  Post vital signs: Reviewed and stable  Last Vitals:  Vitals:   03/26/17 0854 03/26/17 1103  BP: 137/82 (!) 141/96  Pulse:  (!) 110  Resp: (!) 24 (!) 6  Temp: 37.4 C   SpO2: 93% 95%    Last Pain:  Vitals:   03/26/17 1103  TempSrc: Oral  PainSc:       Patients Stated Pain Goal: 5 (03/26/17 0854)  Complications: No apparent anesthesia complications

## 2017-03-26 NOTE — Anesthesia Preprocedure Evaluation (Signed)
Anesthesia Evaluation  Patient identified by MRN, date of birth, ID band Patient awake    Reviewed: Allergy & Precautions, NPO status   Airway Mallampati: I       Dental no notable dental hx. (+) Teeth Intact   Pulmonary former smoker,    Pulmonary exam normal breath sounds clear to auscultation       Cardiovascular negative cardio ROS Normal cardiovascular exam Rhythm:Regular Rate:Normal     Neuro/Psych negative psych ROS   GI/Hepatic negative GI ROS, Neg liver ROS,   Endo/Other  negative endocrine ROS  Renal/GU negative Renal ROS  negative genitourinary   Musculoskeletal negative musculoskeletal ROS (+)   Abdominal (+) + obese,   Peds  Hematology negative hematology ROS (+)   Anesthesia Other Findings   Reproductive/Obstetrics                             Anesthesia Physical Anesthesia Plan  ASA: II  Anesthesia Plan: General   Post-op Pain Management:    Induction: Intravenous  PONV Risk Score and Plan: 4 or greater and Ondansetron, Dexamethasone and Scopolamine patch - Pre-op  Airway Management Planned: Oral ETT  Additional Equipment:   Intra-op Plan:   Post-operative Plan: Extubation in OR  Informed Consent: I have reviewed the patients History and Physical, chart, labs and discussed the procedure including the risks, benefits and alternatives for the proposed anesthesia with the patient or authorized representative who has indicated his/her understanding and acceptance.   Dental advisory given  Plan Discussed with: CRNA and Surgeon  Anesthesia Plan Comments:         Anesthesia Quick Evaluation

## 2017-03-26 NOTE — Progress Notes (Signed)
Patient transported to endoscopy on 2nd floor via wheelchair.  IV saline locked, patient A&Ox4.

## 2017-03-26 NOTE — Interval H&P Note (Signed)
History and Physical Interval Note:  03/26/2017 9:16 AM  Jeb LeveringJennifer Furia  has presented today for surgery, with the diagnosis of choledocholithiasis, ? post op bile leak.  The various methods of treatment have been discussed with the patient and family. After consideration of risks, benefits and other options for treatment, the patient has consented to  Procedure(s): ENDOSCOPIC RETROGRADE CHOLANGIOPANCREATOGRAPHY (ERCP) (N/A) as a surgical intervention .  The patient's history has been reviewed, patient examined, no change in status, stable for surgery.  I have reviewed the patient's chart and labs.  Questions were answered to the patient's satisfaction.    I explained the indication and the procedure in detail (showing patient diagrams of anatomy), and plan for stent placement in the presence of endoscopy nurse.  She is agreeable.  Charlie PitterHenry L Danis III

## 2017-03-26 NOTE — Progress Notes (Signed)
PROGRESS NOTE   Brenda LeveringJennifer Luna  ZOX:[id number]    DOB: 08/01/1980    DOA: 03/23/2017  PCP: Kendra Opitzole, Dawn Watkins, NP   I have briefly reviewed patients previous medical records in [facility].  Brief Narrative:  36 year old female patient with PMH of PCOS underwent elective laparoscopic cholecystectomy [facility] on 12/3, did well for the first couple days then noted progressively worsening abdominal pain and presented [facility] CT abdomen there showed possible retained stone, possible cholangitis but no pancreatitis. MRCP or ERCP were recommended but not available at [facility] and hence [facility]. MRCP >HIDA scan confirmed biliary leak. [facility] and [facility] consulted. Tentatively plan for ERCP to stent bile duct on 02/24/17.   Assessment & Plan:   Active Problems:   History of biliary colic   UTI (urinary tract infection)   Abdominal pain   Transaminitis   Cholangitis   Bile duct stone   Bile leak   1. Bile leak status post laparoscopic cholecystectomy 12/4: MRCP results appreciated. HIDA scan confirmed bile leak. She was treated with IV antibiotics and bowel rest. [facility] assisting. General surgery on board and hopeful that the stent will work and no surgery will be required. Underwent ERCP 12/14 which confirmed bile leak. She underwent biliary sphincterotomy, plastic stent placement in the CBD. CT abdomen was repeated which shows slight increase in volume of perihepatic ascites compared from MRI 12/11. Await GI/CCS follow-up regarding need for percutaneous drain. Clear liquid diet. Patient will need repeat ERCP for stent removal and confirmation of bile leak resolution in several weeks. 2. Hypokalemia: Replaced 3. Hypomagnesemia: Replace IV and follow. 4. History of PCOS    DVT prophylaxis: SCD's Code Status: Full Family Communication: None at bedside Disposition: DC home when medically improved.   Consultants:  Corinda GublerLebauer  GI General surgery   Procedures:  None  Antimicrobials:  IV ceftriaxone and metronidazole    Subjective: Patient was seen post procedure. She reported some right upper quadrant pressure which is slowly decreasing. Denies any other complaints.  ROS: As above  Objective:  Vitals:   03/26/17 1110 03/26/17 1120 03/26/17 1153 03/26/17 1450  BP: (!) 144/86 137/78 134/89 126/81  Pulse: (!) 102 92 98 96  Resp: 16 14  16   Temp:   99 F (37.2 C) 98.2 F (36.8 C)  TempSrc:   Oral Oral  SpO2: 94% 94%  91%  Weight:      Height:        Examination:  General exam: Pleasant young female lying comfortably supine in bed. Respiratory system: Clear to auscultation. Respiratory effort normal. stable without change. Cardiovascular system: S1 & S2 heard, RRR. No JVD, murmurs, rubs, gallops or clicks. No pedal edema. stable without change.  Gastrointestinal system: Abdomen is nondistended, soft. Minimal right upper quadrant tenderness without peritoneal signs. Epigastric laparoscopic incision site without pus drainage today. Other sites look okay. Central nervous system: Alert and oriented. No focal neurological deficits. Stable. Extremities: Symmetric 5 x 5 power. Skin: No rashes, lesions or ulcers Psychiatry: Judgement and insight appear normal. Mood & affect appropriate.     Data Reviewed: I have personally reviewed following labs and imaging studies  CBC: Recent Labs  Lab 03/23/17 1134 03/24/17 0349 03/25/17 0635  WBC 11.3* 9.4 8.1  NEUTROABS 8.7*  --  5.0  HGB 13.1 13.1 12.3  HCT 40.2 38.4 37.2  MCV 92.6 90.6 91.0  PLT 274 301 359   Basic Metabolic Panel: Recent Labs  Lab 03/23/17 1134 03/24/17 0349 03/25/17 0635 03/26/17 0526  NA 138 135 138 138  K 3.7 3.3* 3.3* 3.7  CL 109 106 106 105  CO2 18* 18* 24 24  GLUCOSE 75 97 115* 115*  BUN <5* <5* <5* <5*  CREATININE 0.60 0.52 0.64 0.64  CALCIUM 8.7* 8.4* 8.4* 8.7*  MG  --   --   --  1.6*   Liver Function  Tests: Recent Labs  Lab 03/23/17 1134 03/24/17 0349 03/25/17 0635 03/26/17 0526  AST 28 19 17  14*  ALT 184* 133* 91* 74*  ALKPHOS 288* 232* 223* 219*  BILITOT 2.3* 1.8* 1.5* 1.5*  PROT 6.4* 6.0* 5.4* 5.7*  ALBUMIN 3.2* 2.8* 2.6* 2.7*   Coagulation Profile: Recent Labs  Lab 03/23/17 1134 03/24/17 0349  INR 1.13 1.08   HbA1C: No results for input(s): HGBA1C in the last 72 hours. CBG: Recent Labs  Lab 03/23/17 1345  GLUCAP 78    No results found for this or any previous visit (from the past 240 hour(s)).       Radiology Studies: Ct Abdomen W Contrast  Result Date: 03/26/2017 CLINICAL DATA:  Evaluate for bile leak. EXAM: CT ABDOMEN WITH CONTRAST TECHNIQUE: Multidetector CT imaging of the abdomen was performed using the standard protocol following bolus administration of intravenous contrast. CONTRAST:  75mL ISOVUE-300 IOPAMIDOL (ISOVUE-300) INJECTION 61% COMPARISON:  MRI 03/23/2017 FINDINGS: Lower chest: There is atelectasis identified within both lung bases. Hepatobiliary: Pneumobilia identified. There has been interval placement of a common bile duct stent with decompression of previous dilated intrahepatic bile ducts. Pancreas: Normal appearance of the pancreas. Spleen: The spleen is unremarkable. Adrenals/Urinary Tract: The adrenal glands are normal. Normal appearance of the kidneys. Stomach/Bowel: The stomach is normal. The small bowel loops have a normal course and caliber. Scratch set mild increase caliber of the small bowel loops measuring up to 3 cm. Normal appearance of the visualized large bowel loops. Vascular/Lymphatic: Normal appearance of the abdominal aorta. No enlarged upper abdominal lymph nodes. Other: Subhepatic fluid collection is again identified compatible with bile leak. This demonstrates mild increase in thickness when compared with previous MRI. Distance between the dome of liver and diaphragm measures 4.8 cm on the current exam, image 55 of series 6. 3.1  cm previously. Musculoskeletal: The visualized osseous structures are unremarkable. IMPRESSION: 1. Slight increase in volume of perihepatic ascites when compared with MRI from 03/23/2017. 2. Pneumobilia and decreased intrahepatic biliary dilatation status post common bile duct stenting. These results will be called to the ordering clinician or representative by the Radiologist Assistant, and communication documented in the PACS or zVision Dashboard. Electronically Signed   By: Signa Kell M.D.   On: 03/26/2017 17:31   Dg Ercp Biliary & Pancreatic Ducts  Result Date: 03/26/2017 CLINICAL DATA:  Bile duct stone. Possible postop bile leak. Recent cholecystectomy. EXAM: ERCP TECHNIQUE: Multiple spot images obtained with the fluoroscopic device and submitted for interpretation post-procedure. FLUOROSCOPY TIME:  Fluoroscopy Time:  6 minutes and 10 seconds Number of Acquired Spot Images: 7 COMPARISON:  MRI 11/22/2009 2018 FINDINGS: Cannulation and opacification of the biliary system. There is a small round filling defect in the common bile duct and this is suggestive for stone. Mild dilatation of the extrahepatic biliary system. Contrast is clearly seen outside of the biliary system. Contrast is layering along the inferior aspect of the liver. The source of the bile leak may be from a inferior right intrahepatic branch. Balloon sweep was performed and non metallic biliary  stent was placed in the common bile duct. IMPRESSION: Positive for a bile leak. Bile leak may be originating from a right intrahepatic branch. Choledocholithiasis.  Stone removal and placement of biliary stent. These images were submitted for radiologic interpretation only. Please see the procedural report for the amount of contrast and the fluoroscopy time utilized. Electronically Signed   By: Richarda OverlieAdam  Henn M.D.   On: 03/26/2017 11:20        Scheduled Meds:  Continuous Infusions: . cefTRIAXone (ROCEPHIN)  IV 0 g (03/25/17 1201)     LOS:  3 days     Marcellus ScottAnand Jakye Mullens, MD, FACP, [facility]. [facility] Pager [phone]  If 7PM-7AM, please contact night-coverage [email or web address] Password [email or web address] 03/26/2017, 5:38 PM

## 2017-03-26 NOTE — Anesthesia Procedure Notes (Signed)
Procedure Name: Intubation Date/Time: 03/26/2017 9:47 AM Performed by: Gwenyth AllegraAdami, Illona Bulman, CRNA Pre-anesthesia Checklist: Patient identified, Emergency Drugs available, Suction available, Patient being monitored and Timeout performed Patient Re-evaluated:Patient Re-evaluated prior to induction Oxygen Delivery Method: Circle system utilized Preoxygenation: Pre-oxygenation with 100% oxygen Induction Type: IV induction Ventilation: Mask ventilation without difficulty and Oral airway inserted - appropriate to patient size Grade View: Grade I Tube type: Oral Tube size: 7.0 mm Number of attempts: 1 Airway Equipment and Method: Stylet Placement Confirmation: ETT inserted through vocal cords under direct vision,  positive ETCO2 and breath sounds checked- equal and bilateral Secured at: 21 cm Tube secured with: Tape Dental Injury: Teeth and Oropharynx as per pre-operative assessment

## 2017-03-26 NOTE — Op Note (Signed)
[facility] Patient Name: Brenda LeveringJennifer Leicht Procedure Date : 03/26/2017 MRN: [id number] Attending MD: Starr LakeHenry L. Myrtie Neitheranis , MD Date of Birth: 05/02/1980 CSN: [id number] Age: [age] Admit Type: Inpatient Procedure:                ERCP Indications:              Evaluation and possible treatment of bile duct                            stone(s), Bile leak Providers:                Sherilyn CooterHenry L. Myrtie Neitheranis, MD, Dwain SarnaPatricia Ford, RN, Arlee Muslimhris                            Chandler Tech., Technician, Gwenyth Allegraichard Adami CRNA, CRNA Referring MD:              Medicines:                General Anesthesia, Indomethacin 100 mg PR Complications:            No immediate complications. Estimated Blood Loss:     Estimated blood loss: none. Procedure:                Pre-Anesthesia Assessment:                           - Prior to the procedure, a History and Physical                            was performed, and patient medications and                            allergies were reviewed. The patient's tolerance of                            previous anesthesia was also reviewed. The risks                            and benefits of the procedure and the sedation                            options and risks were discussed with the patient.                            All questions were answered, and informed consent                            was obtained. Prior Anticoagulants: The patient has                            taken no previous anticoagulant or antiplatelet                            agents. ASA Grade Assessment: II - A patient with  mild systemic disease. After reviewing the risks                            and benefits, the patient was deemed in                            satisfactory condition to undergo the procedure.                           After obtaining informed consent, the scope was                            passed under direct vision. Throughout the   procedure, the patient's blood pressure, pulse, and                            oxygen saturations were monitored continuously. The                            [id number] scope was introduced through the                            mouth, and used to inject contrast into and used to                            inject contrast into the bile duct. The ERCP was                            accomplished without difficulty. The patient                            tolerated the procedure well. Scope In: Scope Out: Findings:      A scout film of the abdomen was obtained. Surgical clips, consistent       with a previous cholecystectomy, were seen in the area of the right       upper quadrant of the abdomen. The esophagus was successfully intubated       under direct vision. The scope was advanced to a normal major papilla in       the descending duodenum without detailed examination of the pharynx,       larynx and associated structures, and upper GI tract. The upper GI tract       was grossly normal except for a markedly J-shaped stomach requiring the       patient to be rolled to the left lateral decubitus position in order to       aid passage of the duodenoscope to the major papilla. The major papilla       was also in a short position. An 0.035 inch x 260 cm straight Hydra       Jagwire was passed into the biliary tree. The wire was not passed into       the pancreatic duct. The traction (standard) sphincterotome was passed       over the guidewire and the bile duct was then deeply cannulated.       Contrast was injected. I personally interpreted the bile duct images.  There was brisk flow of contrast through the ducts. Image quality was       excellent. Contrast extended to the hepatic ducts. The common bile duct       contained one mobile stone, which was 6 mm in diameter. Extravasation of       contrast originating from the right intrahepatic branches was observed,       and was seen  tracking along the inferior and then lateral aspect of the       liver, coinciding with findings on recent MRI and HIDA scan. An 8 mm       biliary sphincterotomy was made with a traction (standard)       sphincterotome using blended current. There was no post-sphincterotomy       bleeding. The biliary tree was swept with a 12 mm balloon starting at       the bifurcation. One stone was removed. No stones remainedafter a second       sweep, confirmed by good quality occlusion cholangiogram. One 10 Fr by 7       cm plastic stent with a single external flap and a single internal flap       was placed into the common bile duct. Bile flowed through the stent. The       stent was in good position. Impression:               - A bile leak was found.                           - Choledocholithiasis was found. Complete removal                            was accomplished by biliary sphincterotomy and                            balloon extraction.                           - A biliary sphincterotomy was performed.                           - The biliary tree was swept.                           - One plastic stent was placed into the common bile                            duct. Recommendation:           - Observe patient's clinical course.                           - Consider repeat CT imaging of abdomen and pelvis                            to assess any progression of intra-abdominal and                            perihepatic fluid collection since last imaging and  see if percutaneous drainage needed due to                            patient's ongoing pain.                           - Clear liquid diet.                           - the patient will need a repeat ERCP for stent                            removal and confirmation of bile leak resolution in                            several weeks. Procedure Code(s):        --- Professional ---                            [phone], Endoscopic retrograde                            cholangiopancreatography (ERCP); with placement of                            endoscopic stent into biliary or pancreatic duct,                            including pre- and post-dilation and guide wire                            passage, when performed, including sphincterotomy,                            when performed, each stent                           [id number], Endoscopic retrograde                            cholangiopancreatography (ERCP); with removal of                            calculi/debris from biliary/pancreatic duct(s) Diagnosis Code(s):        --- Professional ---                           K83.9, Disease of biliary tract, unspecified                           K80.50, Calculus of bile duct without cholangitis                            or cholecystitis without obstruction                           K83.8, Other  specified diseases of biliary tract CPT copyright 2016 American Medical Association. All rights reserved. The codes documented in this report are preliminary and upon coder review may  be revised to meet current compliance requirements. Blayn Whetsell L. Myrtie Neither, MD 03/26/2017 11:01:05 AM This report has been signed electronically. Number of Addenda: 0

## 2017-03-26 NOTE — Progress Notes (Signed)
[facility] Progress Note      Assessment/Plan Active Problems: History of biliary colic UTI (urinary tract infection) Abdominal pain Transaminitis Cholangitis  Bile Leak s/p lap chole 12/04 - GI ERCP 12/14 - hopefully the stent will work and no surgery will be required.  Port incision in epigastric region - mild exudate from under scab, applied dressing, will monitor, daily dressing changes  We will follow along   LOS: 3 days    Subjective: CC: upper abdominal pain  Pt is going for ERCP today. Pain about the same. No new complaints. No vomiting overnight.   Objective: Vital signs in last 24 hours: Temp:  [98.2 F (36.8 C)-98.8 F (37.1 C)] 98.8 F (37.1 C) (12/14 0552) Pulse Rate:  [76-85] 85 (12/14 0552) Resp:  [17-18] 18 (12/14 0552) BP: (111-122)/(62-75) 122/75 (12/14 0552) SpO2:  [93 %-99 %] 93 % (12/14 0552) Weight:  [220 lb 12.8 oz (100.2 kg)] 220 lb 12.8 oz (100.2 kg) (12/14 0552) Last BM Date: 03/15/17  Intake/Output from previous day: 12/13 0701 - 12/14 0700 In: 3622.6 [P.O.:60; I.V.:2612.6; IV Piggyback:950] Out: -  Intake/Output this shift: No intake/output data recorded.  PE: Gen:  Alert, NAD, pleasant, cooperative Pulm:  Rate and effort normal Abd: Soft, not distended, +BS, mild RUQ and epigastric TTP without guarding, port site with minimal purulent drainage, cleaned wound and dressing applied. Subcutaneous sutures intact Skin: no rashes noted, warm and dry  Anti-infectives: Anti-infectives (From admission, onward)   Start     Dose/Rate Route Frequency Ordered Stop   03/23/17 1130  metroNIDAZOLE (FLAGYL) IVPB 500 mg     500 mg 100 mL/hr over 60 Minutes Intravenous Every 8 hours 03/23/17 1109     03/23/17 1130  cefTRIAXone (ROCEPHIN) 2 g in dextrose 5 % 50 mL IVPB     2 g 100 mL/hr over 30 Minutes Intravenous Every 24 hours 03/23/17 1112        Lab Results:  Recent Labs    03/24/17 0349 03/25/17 0635   WBC 9.4 8.1  HGB 13.1 12.3  HCT 38.4 37.2  PLT 301 359   BMET Recent Labs    03/25/17 0635 03/26/17 0526  NA 138 138  K 3.3* 3.7  CL 106 105  CO2 24 24  GLUCOSE 115* 115*  BUN <5* <5*  CREATININE 0.64 0.64  CALCIUM 8.4* 8.7*   PT/INR Recent Labs    03/23/17 1134 03/24/17 0349  LABPROT 14.4 13.9  INR 1.13 1.08   CMP     Component Value Date/Time   NA 138 03/26/2017 0526   K 3.7 03/26/2017 0526   CL 105 03/26/2017 0526   CO2 24 03/26/2017 0526   GLUCOSE 115 (H) 03/26/2017 0526   BUN <5 (L) 03/26/2017 0526   CREATININE 0.64 03/26/2017 0526   CALCIUM 8.7 (L) 03/26/2017 0526   PROT 5.7 (L) 03/26/2017 0526   ALBUMIN 2.7 (L) 03/26/2017 0526   AST 14 (L) 03/26/2017 0526   ALT 74 (H) 03/26/2017 0526   ALKPHOS 219 (H) 03/26/2017 0526   BILITOT 1.5 (H) 03/26/2017 0526   GFRNONAA >60 03/26/2017 0526   GFRAA >60 03/26/2017 0526   Lipase  No results found for: LIPASE  Studies/Results: Nm Hepatobiliary Liver Func  Result Date: 03/24/2017 CLINICAL DATA:  36 year old female postoperative day 9 coli cystectomy. Abdomen MRI yesterday with increasing perihepatic free fluid suspicious for bile leak. EXAM: NUCLEAR MEDICINE HEPATOBILIARY IMAGING TECHNIQUE: Sequential images of the abdomen were obtained out to 60 minutes following  intravenous administration of radiopharmaceutical. RADIOPHARMACEUTICALS:  5  mCi [id number]  Choletec IV COMPARISON:  Abdomen MRI 03/23/2017 and earlier. FINDINGS: Initially there is prompt radiotracer uptake by the liver and clearance of the blood pool. CBD activity is visible by 20 minutes. Small bowel activity visible by 20 minutes and increases over the course of the study. At the same time there is subtle abnormal radiotracer accumulation occurring along the inferior right liver tip (Image 13. Progressive radiotracer activity then along the right lateral and superior perihepatic space, such that after 1 hour the more concentrated perihepatic radiotracer  activity creates a negative silhouette of the hepatic contour (image 60). This exam was reviewed with Dr. Amil AmenEdmunds. IMPRESSION: 1. Positive for bile leak with perihepatic accumulation of the excreted hepatic radiotracer corresponding to the perihepatic fluid collection on MRI yesterday. This pattern suggests a subcapsular bile leak. 2. The CBD is patent. Electronically Signed   By: Odessa FlemingH  Hall M.D.   On: 03/24/2017 15:14      Jerre SimonJessica L Anorah Trias , [facility] 03/26/2017, 8:04 AM Pager: [phone] Consults: [phone] Mon-Fri 7:00 am-4:30 pm Sat-Sun 7:00 am-11:30 am

## 2017-03-26 NOTE — Progress Notes (Signed)
Patient returned to 6N29 A&Ox4, VSS, IV intact.  Voided upon return to room.  Patient rates pain 4/10.  Will continue to monitor.

## 2017-03-27 ENCOUNTER — Inpatient Hospital Stay (HOSPITAL_COMMUNITY): Payer: BLUE CROSS/BLUE SHIELD

## 2017-03-27 DIAGNOSIS — K668 Other specified disorders of peritoneum: Secondary | ICD-10-CM

## 2017-03-27 LAB — COMPREHENSIVE METABOLIC PANEL
ALT: 58 U/L — ABNORMAL HIGH (ref 14–54)
ALT: 64 U/L — ABNORMAL HIGH (ref 14–54)
AST: 22 U/L (ref 15–41)
AST: 30 U/L (ref 15–41)
Albumin: 2.6 g/dL — ABNORMAL LOW (ref 3.5–5.0)
Albumin: 2.9 g/dL — ABNORMAL LOW (ref 3.5–5.0)
Alkaline Phosphatase: 225 U/L — ABNORMAL HIGH (ref 38–126)
Alkaline Phosphatase: 236 U/L — ABNORMAL HIGH (ref 38–126)
Anion gap: 11 (ref 5–15)
Anion gap: 10 (ref 5–15)
BUN: 5 mg/dL — ABNORMAL LOW (ref 6–20)
BUN: <5 mg/dL — ABNORMAL LOW (ref 6–20)
CO2: 24 mmol/L (ref 22–32)
CO2: 23 mmol/L (ref 22–32)
Calcium: 8.6 mg/dL — ABNORMAL LOW (ref 8.9–10.3)
Calcium: 8.7 mg/dL — ABNORMAL LOW (ref 8.9–10.3)
Chloride: 106 mmol/L (ref 101–111)
Chloride: 106 mmol/L (ref 101–111)
Creatinine, Ser: 0.64 mg/dL (ref 0.44–1.00)
Creatinine, Ser: 0.66 mg/dL (ref 0.44–1.00)
GFR calc Af Amer: >60 mL/min (ref >60)
GFR calc Af Amer: >60 mL/min (ref >60)
GFR calc non Af Amer: >60 mL/min (ref >60)
GFR calc non Af Amer: >60 mL/min (ref >60)
Glucose, Bld: 93 mg/dL (ref 65–99)
Glucose, Bld: 99 mg/dL (ref 65–99)
Potassium: 3.2 mmol/L — ABNORMAL LOW (ref 3.5–5.1)
Potassium: 2.8 mmol/L — ABNORMAL LOW (ref 3.5–5.1)
Sodium: 141 mmol/L (ref 135–145)
Sodium: 139 mmol/L (ref 135–145)
Total Bilirubin: 1.4 mg/dL — ABNORMAL HIGH (ref 0.3–1.2)
Total Bilirubin: 1.9 mg/dL — ABNORMAL HIGH (ref 0.3–1.2)
Total Protein: 5.5 g/dL — ABNORMAL LOW (ref 6.5–8.1)
Total Protein: 6 g/dL — ABNORMAL LOW (ref 6.5–8.1)

## 2017-03-27 LAB — CBC
HCT: 37.1 % (ref 36.0–46.0)
HCT: 39.1 % (ref 36.0–46.0)
Hemoglobin: 12.5 g/dL (ref 12.0–15.0)
Hemoglobin: 13.3 g/dL (ref 12.0–15.0)
MCH: 30.4 pg (ref 26.0–34.0)
MCH: 30.9 pg (ref 26.0–34.0)
MCHC: 33.7 g/dL (ref 30.0–36.0)
MCHC: 34 g/dL (ref 30.0–36.0)
MCV: 90.3 fL (ref 78.0–100.0)
MCV: 90.7 fL (ref 78.0–100.0)
Platelets: 400 10*3/uL (ref 150–400)
Platelets: 448 10*3/uL — ABNORMAL HIGH (ref 150–400)
RBC: 4.11 MIL/uL (ref 3.87–5.11)
RBC: 4.31 MIL/uL (ref 3.87–5.11)
RDW: 13.4 % (ref 11.5–15.5)
RDW: 13.6 % (ref 11.5–15.5)
WBC: 12.4 10*3/uL — ABNORMAL HIGH (ref 4.0–10.5)
WBC: 12.7 10*3/uL — ABNORMAL HIGH (ref 4.0–10.5)

## 2017-03-27 LAB — MAGNESIUM: Magnesium: 2 mg/dL (ref 1.7–2.4)

## 2017-03-27 MED ORDER — POTASSIUM CHLORIDE CRYS ER 20 MEQ PO TBCR
40.0000 meq | EXTENDED_RELEASE_TABLET | Freq: Once | ORAL | Status: AC
  Administered 2017-03-27: 40 meq via ORAL
  Filled 2017-03-27: qty 2

## 2017-03-27 MED ORDER — MIDAZOLAM HCL 2 MG/2ML IJ SOLN
INTRAMUSCULAR | Status: AC
  Filled 2017-03-27: qty 4

## 2017-03-27 MED ORDER — MIDAZOLAM HCL 2 MG/2ML IJ SOLN
INTRAMUSCULAR | Status: AC | PRN
  Administered 2017-03-27 (×3): 1 mg via INTRAVENOUS

## 2017-03-27 MED ORDER — SODIUM CHLORIDE 0.9% FLUSH
5.0000 mL | Freq: Three times a day (TID) | INTRAVENOUS | Status: DC
  Administered 2017-03-27 – 2017-03-29 (×6): 5 mL via INTRAVENOUS

## 2017-03-27 MED ORDER — POTASSIUM CHLORIDE 2 MEQ/ML IV SOLN
INTRAVENOUS | Status: DC
  Administered 2017-03-27 – 2017-03-28 (×3): via INTRAVENOUS
  Filled 2017-03-27 (×6): qty 1000

## 2017-03-27 MED ORDER — LIDOCAINE HCL 1 % IJ SOLN
INTRAMUSCULAR | Status: AC
  Filled 2017-03-27: qty 20

## 2017-03-27 MED ORDER — FENTANYL CITRATE (PF) 100 MCG/2ML IJ SOLN
INTRAMUSCULAR | Status: AC | PRN
  Administered 2017-03-27 (×3): 50 ug via INTRAVENOUS

## 2017-03-27 MED ORDER — FENTANYL CITRATE (PF) 100 MCG/2ML IJ SOLN
INTRAMUSCULAR | Status: AC
  Filled 2017-03-27: qty 4

## 2017-03-27 NOTE — Progress Notes (Signed)
PROGRESS NOTE   Jeb LeveringJennifer Cwynar  EAV:[id number]    DOB: 06/21/1980    DOA: 03/23/2017  PCP: Kendra Opitzole, Dawn Watkins, NP   I have briefly reviewed patients [facility] medical [facility].  Brief Narrative:  36 year old female patient with PMH of PCOS underwent elective laparoscopic cholecystectomy [facility] on 12/3, did well for the first couple days then noted progressively worsening abdominal pain and presented to [facility]. CT abdomen there showed possible retained stone, possible cholangitis but no pancreatitis. MRCP or ERCP were recommended but not available at [facility] and hence [facility]. MRCP >HIDA scan confirmed biliary leak. [facility] and [facility] consulted. Tentatively plan for ERCP to stent bile duct on 02/24/17.   Assessment & Plan:   Active Problems:   History of biliary colic   UTI (urinary tract infection)   Abdominal pain   Transaminitis   Cholangitis   Bile duct stone   Bile leak   1. Bile leak status post laparoscopic cholecystectomy 12/4: MRCP results appreciated. HIDA scan confirmed bile leak. She was treated with IV antibiotics and bowel rest. [facility] assisting. General surgery on board and hopeful that the stent will work and no surgery will be required. Underwent ERCP 12/14 which confirmed bile leak. She underwent biliary sphincterotomy, plastic stent placement in the CBD. CT abdomen was repeated which showed slight increase in volume of perihepatic ascites compared from MRI 12/11. As per [facility] recommendation, IR placed right perihepatic bilioma drain 12/15. Patient will need repeat ERCP for stent removal and confirmation of bile leak resolution in several weeks. 2. Hypokalemia: Replace and follow. 3. Hypomagnesemia: Replaced 4. History of PCOS    DVT prophylaxis: SCD's Code Status: Full Family Communication: None at bedside Disposition: DC home when medically improved.   Consultants:  Corinda GublerLebauer  GI General surgery  IR  Procedures:  ERCP 12/14 biliary sphincterotomy, plastic stent placement in the CBD. Percutaneous right perihepatic bilioma drain 12/15  Antimicrobials:  IV ceftriaxone and metronidazole    Subjective: Seen this morning prior to procedure. Abdominal pain better than on admission but still 4/10 RUQ constant. No nausea or vomiting.  ROS: As above  Objective:  Vitals:   03/27/17 1305 03/27/17 1310 03/27/17 1325 03/27/17 1342  BP: 104/64 101/62 100/62 127/79  Pulse: 79 80 78 85  Resp: 14 19 18 17   Temp:    (!) 97.5 F (36.4 C)  TempSrc:    Oral  SpO2: 94% 98% 98% 98%  Weight:      Height:        Examination:  General exam: Pleasant young female sitting up comfortably in chair. Respiratory system: Clear to auscultation. Respiratory effort normal. Stable Cardiovascular system: S1 & S2 heard, RRR. No JVD, murmurs, rubs, gallops or clicks. No pedal edema. Stable. Gastrointestinal system: Abdomen is nondistended, soft. Minimal RUQ tenderness without peritoneal signs. Epigastric laparoscopic incision site without pus drainage today. Other sites look okay. Central nervous system: Alert and oriented. No focal neurological deficits. Stable. Extremities: Symmetric 5 x 5 power. Skin: No rashes, lesions or ulcers Psychiatry: Judgement and insight appear normal. Mood & affect appropriate.     Data Reviewed: I have personally reviewed following labs and imaging studies  CBC: Recent Labs  Lab 03/23/17 1134 03/24/17 0349 03/25/17 0635 03/27/17 0649 03/27/17 1009  WBC 11.3* 9.4 8.1 12.4* 12.7*  NEUTROABS 8.7*  --  5.0  --   --   HGB 13.1 13.1 12.3 12.5 13.3  HCT 40.2  38.4 37.2 37.1 39.1  MCV 92.6 90.6 91.0 90.3 90.7  PLT 274 301 359 400 448*   Basic Metabolic Panel: Recent Labs  Lab 03/24/17 0349 03/25/17 0635 03/26/17 0526 03/27/17 0649 03/27/17 1009  NA 135 138 138 141 139  K 3.3* 3.3* 3.7 3.2* 2.8*  CL 106 106 105 106 106  CO2 18* 24 24 24  23   GLUCOSE 97 115* 115* 93 99  BUN <5* <5* <5* 5* <5*  CREATININE 0.52 0.64 0.64 0.64 0.66  CALCIUM 8.4* 8.4* 8.7* 8.6* 8.7*  MG  --   --  1.6* 2.0  --    Liver Function Tests: Recent Labs  Lab 03/24/17 0349 03/25/17 0635 03/26/17 0526 03/27/17 0649 03/27/17 1009  AST 19 17 14* 22 30  ALT 133* 91* 74* 58* 64*  ALKPHOS 232* 223* 219* 225* 236*  BILITOT 1.8* 1.5* 1.5* 1.4* 1.9*  PROT 6.0* 5.4* 5.7* 5.5* 6.0*  ALBUMIN 2.8* 2.6* 2.7* 2.6* 2.9*   Coagulation Profile: Recent Labs  Lab 03/23/17 1134 03/24/17 0349  INR 1.13 1.08   HbA1C: No results for input(s): HGBA1C in the last 72 hours. CBG: Recent Labs  Lab 03/23/17 1345  GLUCAP 78    No results found for this or any previous visit (from the past 240 hour(s)).       Radiology Studies: Ct Abdomen W Contrast  Result Date: 03/26/2017 CLINICAL DATA:  Evaluate for bile leak. EXAM: CT ABDOMEN WITH CONTRAST TECHNIQUE: Multidetector CT imaging of the abdomen was performed using the standard protocol following bolus administration of intravenous contrast. CONTRAST:  75mL ISOVUE-300 IOPAMIDOL (ISOVUE-300) INJECTION 61% COMPARISON:  MRI 03/23/2017 FINDINGS: Lower chest: There is atelectasis identified within both lung bases. Hepatobiliary: Pneumobilia identified. There has been interval placement of a common bile duct stent with decompression of previous dilated intrahepatic bile ducts. Pancreas: Normal appearance of the pancreas. Spleen: The spleen is unremarkable. Adrenals/Urinary Tract: The adrenal glands are normal. Normal appearance of the kidneys. Stomach/Bowel: The stomach is normal. The small bowel loops have a normal course and caliber. Scratch set mild increase caliber of the small bowel loops measuring up to 3 cm. Normal appearance of the visualized large bowel loops. Vascular/Lymphatic: Normal appearance of the abdominal aorta. No enlarged upper abdominal lymph nodes. Other: Subhepatic fluid collection is again  identified compatible with bile leak. This demonstrates mild increase in thickness when compared with previous MRI. Distance between the dome of liver and diaphragm measures 4.8 cm on the current exam, image 55 of series 6. 3.1 cm previously. Musculoskeletal: The visualized osseous structures are unremarkable. IMPRESSION: 1. Slight increase in volume of perihepatic ascites when compared with MRI from 03/23/2017. 2. Pneumobilia and decreased intrahepatic biliary dilatation status post common bile duct stenting. These results will be called to the ordering clinician or representative by the Radiologist Assistant, and communication documented in the PACS or zVision Dashboard. Electronically Signed   By: Signa Kellaylor  Stroud M.D.   On: 03/26/2017 17:31   Dg Ercp Biliary & Pancreatic Ducts  Result Date: 03/26/2017 CLINICAL DATA:  Bile duct stone. Possible postop bile leak. Recent cholecystectomy. EXAM: ERCP TECHNIQUE: Multiple spot images obtained with the fluoroscopic device and submitted for interpretation post-procedure. FLUOROSCOPY TIME:  Fluoroscopy Time:  6 minutes and 10 seconds Number of Acquired Spot Images: 7 COMPARISON:  MRI 11/22/2009 2018 FINDINGS: Cannulation and opacification of the biliary system. There is a small round filling defect in the common bile duct and this is suggestive for stone. Mild dilatation of  the extrahepatic biliary system. Contrast is clearly seen outside of the biliary system. Contrast is layering along the inferior aspect of the liver. The source of the bile leak may be from a inferior right intrahepatic branch. Balloon sweep was performed and non metallic biliary stent was placed in the common bile duct. IMPRESSION: Positive for a bile leak. Bile leak may be originating from a right intrahepatic branch. Choledocholithiasis.  Stone removal and placement of biliary stent. These images were submitted for radiologic interpretation only. Please see the procedural report for the amount of  contrast and the fluoroscopy time utilized. Electronically Signed   By: Richarda Overlie M.D.   On: 03/26/2017 11:20   US Abdomen Limited Ruq  Result Date: 03/26/2017 CLINICAL DATA:  Post laparoscopic cholecystectomy complicated by bile leak. ERCP with stent placed today. Evaluate biloma. EXAM: ULTRASOUND ABDOMEN LIMITED RIGHT UPPER QUADRANT COMPARISON:  CT abdomen 1 hour prior. FINDINGS: Gallbladder: Gallbladder is surgically absent. No focal fluid collection in the gallbladder fossa. Common bile duct: Diameter: 4 mm proximally, shadowing biliary stent is partially visualized. Liver: Scattered intrahepatic linear shadowing consistent pneumobilia primarily in the left lobe, as seen on CT. No focal hepatic lesion. Hepatic echogenicity is coarsened. Portal vein is patent on color Doppler imaging with normal direction of blood flow towards the liver. Moderate perihepatic fluid, similar to CT 1 hour prior. IMPRESSION: 1. Postcholecystectomy. Biliary stent and intrahepatic pneumobilia, as seen on CT. 2. Perihepatic fluid, similar to CT allowing for differences in modality. Electronically Signed   By: Rubye Oaks M.D.   On: 03/26/2017 18:30        Scheduled Meds: . fentaNYL      . lidocaine      . midazolam      . sodium chloride flush  5 mL Intravenous Q8H   Continuous Infusions: . cefTRIAXone (ROCEPHIN)  IV Stopped (03/27/17 1054)  . lactated ringers 1,000 mL with potassium chloride 30 mEq infusion 125 mL/hr at 03/27/17 1129     LOS: 4 days     Marcellus Scott, MD, FACP, [facility]. [facility] Pager [phone]  If 7PM-7AM, please contact night-coverage [email or web address] Password [email or web address] 03/27/2017, 2:50 PM

## 2017-03-27 NOTE — Sedation Documentation (Signed)
Patient denies pain and is resting comfortably.  

## 2017-03-27 NOTE — Progress Notes (Signed)
1 Day Post-Op  Subjective: Pain is better but not gone.  The pain is primarily upper abdominal and bilateral flanks.  CT scan shows large perihepatic and subphrenic fluid collection without rim enhancement.  No significant subhepatic fluid  ERCP performed yesterday.  Sphincterotomy and stent placement.  Common duct stone removed.  Labs today show potassium 3.2, creatinine 0.64, AST 58. Alk phosphatase 225.  Total bili 1.4.  WBC 12,400.  Hemoglobin 12.5, stable.  Objective: Vital signs in last 24 hours: Temp:  [98.2 F (36.8 C)-99 F (37.2 C)] 98.2 F (36.8 C) (12/15 0451) Pulse Rate:  [74-110] 74 (12/15 0451) Resp:  [10-17] 17 (12/15 0451) BP: (113-144)/(76-96) 113/76 (12/15 0451) SpO2:  [91 %-95 %] 94 % (12/15 0451) Weight:  [94.2 kg (207 lb 10.8 oz)] 94.2 kg (207 lb 10.8 oz) (12/15 0453) Last BM Date: 03/26/17  Intake/Output from previous day: 12/14 0701 - 12/15 0700 In: 1902 [P.O.:802; I.V.:1000; IV Piggyback:100] Out: 0  Intake/Output this shift: Total I/O In: 382 [P.O.:382] Out: -   General appearance: alert.  Cooperative.  Minimal distress.  Mental status normal Resp: clear to auscultation bilaterally GI: oft.  Nondistended.  Wounds clean.  Minimal tenderness.  No guarding. Extremities: extremities normal, atraumatic, no cyanosis or edema  Lab Results:  Recent Labs    03/25/17 0635 03/27/17 0649  WBC 8.1 12.4*  HGB 12.3 12.5  HCT 37.2 37.1  PLT 359 400   BMET Recent Labs    03/26/17 0526 03/27/17 0649  NA 138 141  K 3.7 3.2*  CL 105 106  CO2 24 24  GLUCOSE 115* 93  BUN <5* 5*  CREATININE 0.64 0.64  CALCIUM 8.7* 8.6*   PT/INR No results for input(s): LABPROT, INR in the last 72 hours. ABG No results for input(s): PHART, HCO3 in the last 72 hours.  Invalid input(s): PCO2, PO2  Studies/Results: Ct Abdomen W Contrast  Result Date: 03/26/2017 CLINICAL DATA:  Evaluate for bile leak. EXAM: CT ABDOMEN WITH CONTRAST TECHNIQUE: Multidetector CT  imaging of the abdomen was performed using the standard protocol following bolus administration of intravenous contrast. CONTRAST:  48m ISOVUE-300 IOPAMIDOL (ISOVUE-300) INJECTION 61% COMPARISON:  MRI 03/23/2017 FINDINGS: Lower chest: There is atelectasis identified within both lung bases. Hepatobiliary: Pneumobilia identified. There has been interval placement of a common bile duct stent with decompression of previous dilated intrahepatic bile ducts. Pancreas: Normal appearance of the pancreas. Spleen: The spleen is unremarkable. Adrenals/Urinary Tract: The adrenal glands are normal. Normal appearance of the kidneys. Stomach/Bowel: The stomach is normal. The small bowel loops have a normal course and caliber. Scratch set mild increase caliber of the small bowel loops measuring up to 3 cm. Normal appearance of the visualized large bowel loops. Vascular/Lymphatic: Normal appearance of the abdominal aorta. No enlarged upper abdominal lymph nodes. Other: Subhepatic fluid collection is again identified compatible with bile leak. This demonstrates mild increase in thickness when compared with previous MRI. Distance between the dome of liver and diaphragm measures 4.8 cm on the current exam, image 55 of series 6. 3.1 cm previously. Musculoskeletal: The visualized osseous structures are unremarkable. IMPRESSION: 1. Slight increase in volume of perihepatic ascites when compared with MRI from 03/23/2017. 2. Pneumobilia and decreased intrahepatic biliary dilatation status post common bile duct stenting. These results will be called to the ordering clinician or representative by the Radiologist Assistant, and communication documented in the PACS or zVision Dashboard. Electronically Signed   By: TKerby MoorsM.D.   On: 03/26/2017 17:31  Dg Ercp Biliary & Pancreatic Ducts  Result Date: 03/26/2017 CLINICAL DATA:  Bile duct stone. Possible postop bile leak. Recent cholecystectomy. EXAM: ERCP TECHNIQUE: Multiple spot  images obtained with the fluoroscopic device and submitted for interpretation post-procedure. FLUOROSCOPY TIME:  Fluoroscopy Time:  6 minutes and 10 seconds Number of Acquired Spot Images: 7 COMPARISON:  MRI 11/22/2009 2018 FINDINGS: Cannulation and opacification of the biliary system. There is a small round filling defect in the common bile duct and this is suggestive for stone. Mild dilatation of the extrahepatic biliary system. Contrast is clearly seen outside of the biliary system. Contrast is layering along the inferior aspect of the liver. The source of the bile leak may be from a inferior right intrahepatic branch. Balloon sweep was performed and non metallic biliary stent was placed in the common bile duct. IMPRESSION: Positive for a bile leak. Bile leak may be originating from a right intrahepatic branch. Choledocholithiasis.  Stone removal and placement of biliary stent. These images were submitted for radiologic interpretation only. Please see the procedural report for the amount of contrast and the fluoroscopy time utilized. Electronically Signed   By: Markus Daft M.D.   On: 03/26/2017 11:20   US Abdomen Limited Ruq  Result Date: 03/26/2017 CLINICAL DATA:  Post laparoscopic cholecystectomy complicated by bile leak. ERCP with stent placed today. Evaluate biloma. EXAM: ULTRASOUND ABDOMEN LIMITED RIGHT UPPER QUADRANT COMPARISON:  CT abdomen 1 hour prior. FINDINGS: Gallbladder: Gallbladder is surgically absent. No focal fluid collection in the gallbladder fossa. Common bile duct: Diameter: 4 mm proximally, shadowing biliary stent is partially visualized. Liver: Scattered intrahepatic linear shadowing consistent pneumobilia primarily in the left lobe, as seen on CT. No focal hepatic lesion. Hepatic echogenicity is coarsened. Portal vein is patent on color Doppler imaging with normal direction of blood flow towards the liver. Moderate perihepatic fluid, similar to CT 1 hour prior. IMPRESSION: 1.  Postcholecystectomy. Biliary stent and intrahepatic pneumobilia, as seen on CT. 2. Perihepatic fluid, similar to CT allowing for differences in modality. Electronically Signed   By: Jeb Levering M.D.   On: 03/26/2017 18:30    Anti-infectives: Anti-infectives (From admission, onward)   Start     Dose/Rate Route Frequency Ordered Stop   03/23/17 1130  metroNIDAZOLE (FLAGYL) IVPB 500 mg  Status:  Discontinued     500 mg 100 mL/hr over 60 Minutes Intravenous Every 8 hours 03/23/17 1109 03/26/17 1105   03/23/17 1130  cefTRIAXone (ROCEPHIN) 2 g in dextrose 5 % 50 mL IVPB     2 g 100 mL/hr over 30 Minutes Intravenous Every 24 hours 03/23/17 1112        Assessment/Plan: s/p Procedure(s): ENDOSCOPIC RETROGRADE CHOLANGIOPANCREATOGRAPHY (ERCP)  Bile leak status post laparoscopic cholecystectomy 12 /4, [place]. -ERCP 12/14 shows leak from right hepatic duct which is almost certainly duct of Luschka in gallbladder bed.  This should stop now that the biliary tree is decompressed  Perihepatic fluid collection.  This is a large biloma collection and she is still symptomatic. -We will ask interventional radiology to drain this today under image guidance.  I explained this to the patient -Nothing by mouth except ice chips and meds interval this is done.  Advance diet thereafter -continue Rocephin   LOS: 4 days    Adin Hector 03/27/2017

## 2017-03-27 NOTE — Procedures (Signed)
R perihepatic biloma drain 10 Fr Bilious fluid EBL 0 Comp 0

## 2017-03-27 NOTE — Consult Note (Signed)
Chief Complaint: Patient was seen in consultation today for No chief complaint on file.  at the request of * No referring provider recorded for this case *  Referring Physician(s): * No referring provider recorded for this case *  Supervising Physician: Jolaine Click  Patient Status: [facility] - In-pt  History of Present Illness: Brenda Luna is a 36 y.o. female who had a cholecystectomy complicated by a bile leak. ERCP stent placed yesterday. Recent CT shows a perihepatic fluid collection. Drain is requested.  Past Medical History:  Diagnosis Date  . Biliary colic   . PCOS (polycystic ovarian syndrome)   . Seizures (HCC) 2003   "brought on by RX" (03/23/2017)    Past Surgical History:  Procedure Laterality Date  . LAPAROSCOPIC CHOLECYSTECTOMY  03/15/2017    Allergies: Wellbutrin [bupropion]  Medications: Prior to Admission medications   Not on File     Family History  Problem Relation Age of Onset  . Gallbladder disease Mother   . Hypertension Father     Social History   Socioeconomic History  . Marital status: Married    Spouse name: None  . Number of children: None  . Years of education: None  . Highest education level: None  Social Needs  . Financial resource strain: None  . Food insecurity - worry: None  . Food insecurity - inability: None  . Transportation needs - medical: None  . Transportation needs - non-medical: None  Occupational History  . None  Tobacco Use  . Smoking status: Former Smoker    Packs/day: 0.75    Years: 16.00    Pack years: 12.00    Types: Cigarettes    Last attempt to quit: 03/12/2017    Years since quitting: 0.0  . Smokeless tobacco: Never Used  Substance and Sexual Activity  . Alcohol use: Yes    Frequency: Never    Comment: 03/23/2017 "less than 2 times/year"  . Drug use: No  . Sexual activity: Yes  Other Topics Concern  . None  Social History Narrative  . None      Review of Systems: A 12 point ROS  discussed and pertinent positives are indicated in the HPI above.  All other systems are negative.  Review of Systems  Vital Signs: BP 113/76 (BP Location: Left Arm)   Pulse 74   Temp 98.2 F (36.8 C) (Oral)   Resp 17   Ht 5' 7.75" (1.721 m)   Wt 207 lb 10.8 oz (94.2 kg)   LMP 02/21/2017 (Approximate) Comment: irregular due to PCOS  SpO2 94%   BMI 31.81 kg/m   Physical Exam  Constitutional: She is oriented to person, place, and time. She appears well-developed and well-nourished.  HENT:  Head: Normocephalic and atraumatic.  Cardiovascular: Normal rate and regular rhythm.  Pulmonary/Chest: Effort normal and breath sounds normal.  Abdominal: Soft.  Neurological: She is alert and oriented to person, place, and time.   MP 1    Imaging: Ct Abdomen W Contrast  Result Date: 03/26/2017 CLINICAL DATA:  Evaluate for bile leak. EXAM: CT ABDOMEN WITH CONTRAST TECHNIQUE: Multidetector CT imaging of the abdomen was performed using the standard protocol following bolus administration of intravenous contrast. CONTRAST:  75mL ISOVUE-300 IOPAMIDOL (ISOVUE-300) INJECTION 61% COMPARISON:  MRI 03/23/2017 FINDINGS: Lower chest: There is atelectasis identified within both lung bases. Hepatobiliary: Pneumobilia identified. There has been interval placement of a common bile duct stent with decompression of previous dilated intrahepatic bile ducts. Pancreas: Normal appearance of the pancreas.  Spleen: The spleen is unremarkable. Adrenals/Urinary Tract: The adrenal glands are normal. Normal appearance of the kidneys. Stomach/Bowel: The stomach is normal. The small bowel loops have a normal course and caliber. Scratch set mild increase caliber of the small bowel loops measuring up to 3 cm. Normal appearance of the visualized large bowel loops. Vascular/Lymphatic: Normal appearance of the abdominal aorta. No enlarged upper abdominal lymph nodes. Other: Subhepatic fluid collection is again identified compatible  with bile leak. This demonstrates mild increase in thickness when compared with previous MRI. Distance between the dome of liver and diaphragm measures 4.8 cm on the current exam, image 55 of series 6. 3.1 cm previously. Musculoskeletal: The visualized osseous structures are unremarkable. IMPRESSION: 1. Slight increase in volume of perihepatic ascites when compared with MRI from 03/23/2017. 2. Pneumobilia and decreased intrahepatic biliary dilatation status post common bile duct stenting. These results will be called to the ordering clinician or representative by the Radiologist Assistant, and communication documented in the PACS or zVision Dashboard. Electronically Signed   By: Signa Kellaylor  Stroud M.D.   On: 03/26/2017 17:31   Nm Hepatobiliary Liver Func  Result Date: 03/24/2017 CLINICAL DATA:  36 year old female postoperative day 9 coli cystectomy. Abdomen MRI yesterday with increasing perihepatic free fluid suspicious for bile leak. EXAM: NUCLEAR MEDICINE HEPATOBILIARY IMAGING TECHNIQUE: Sequential images of the abdomen were obtained out to 60 minutes following intravenous administration of radiopharmaceutical. RADIOPHARMACEUTICALS:  5  mCi Tc-1084m  Choletec IV COMPARISON:  Abdomen MRI 03/23/2017 and earlier. FINDINGS: Initially there is prompt radiotracer uptake by the liver and clearance of the blood pool. CBD activity is visible by 20 minutes. Small bowel activity visible by 20 minutes and increases over the course of the study. At the same time there is subtle abnormal radiotracer accumulation occurring along the inferior right liver tip (Image 13. Progressive radiotracer activity then along the right lateral and superior perihepatic space, such that after 1 hour the more concentrated perihepatic radiotracer activity creates a negative silhouette of the hepatic contour (image 60). This exam was reviewed with Dr. Amil AmenEdmunds. IMPRESSION: 1. Positive for bile leak with perihepatic accumulation of the excreted  hepatic radiotracer corresponding to the perihepatic fluid collection on MRI yesterday. This pattern suggests a subcapsular bile leak. 2. The CBD is patent. Electronically Signed   By: Odessa FlemingH  Hall M.D.   On: 03/24/2017 15:14   Mr Abdomen Mrcp Wo Contrast  Result Date: 03/24/2017 CLINICAL DATA:  Biliary colic status post laparoscopic cholecystectomy 03/15/2017. Concern for cholangitis and choledocholithiasis. EXAM: MRI ABDOMEN WITHOUT CONTRAST  (INCLUDING MRCP) TECHNIQUE: Multiplanar multisequence MR imaging of the abdomen was performed. Heavily T2-weighted images of the biliary and pancreatic ducts were obtained, and three-dimensional MRCP images were rendered by post processing. COMPARISON:  Abdominopelvic CT 03/20/2017. FINDINGS: Lower chest: Progressively lower lung volumes with increasing atelectasis at both lung bases. No significant pleural or pericardial effusion. Hepatobiliary: Mild hepatic steatosis with loss of signal on gradient echo opposed phase images. No focal lesions identified on noncontrast imaging. Examination is mildly motion degraded, especially on the thin section MRCP images. The gallbladder is surgically absent. There is no significant fluid collection of the cholecystectomy bed. There is mild biliary dilatation, improved from CT. The common hepatic duct measures up to 9 mm in diameter and the common bile duct 8 mm. No definite retained calculi are seen on the coronal images. On series 8, tiny distal intraductal calculi (images 38 and 39) at the ampulla are difficult to exclude. The ampullary prominence on prior  CT is not evident. Pancreas: Unremarkable. No pancreatic ductal dilatation or surrounding inflammatory changes. Spleen: Normal in size without focal abnormality. Adrenals/Urinary Tract: Both adrenal glands appear normal. The kidneys appear normal without evidence of urinary tract calculus or hydronephrosis. Bladder not imaged. Stomach/Bowel: No evidence of bowel wall thickening,  distention or surrounding inflammatory change. Vascular/Lymphatic: There are no enlarged abdominal lymph nodes. No significant vascular findings. Other: There is increasing ascites which is predominately perihepatic, measuring up to 3.1 cm in thickness. Some fluid extends into the right pericolic gutter. No focal extraluminal collection. Musculoskeletal: No acute or significant osseous findings. IMPRESSION: 1. Compared with the CT of 3 days prior, biliary dilatation has slightly improved. No definite choledocholithiasis. Tiny calculi in the distal common bile duct difficult to completely exclude, but the patient's elevated liver function studies are slowly improving. 2. Increasing perihepatic ascites suspicious for possible biliary leak. Consider nuclear medicine hepatobiliary scan for further evaluation. 3. Mild hepatic steatosis. 4. Progressive atelectasis at both lung bases. Electronically Signed   By: Carey BullocksWilliam  Veazey M.D.   On: 03/24/2017 09:13   Dg Ercp Biliary & Pancreatic Ducts  Result Date: 03/26/2017 CLINICAL DATA:  Bile duct stone. Possible postop bile leak. Recent cholecystectomy. EXAM: ERCP TECHNIQUE: Multiple spot images obtained with the fluoroscopic device and submitted for interpretation post-procedure. FLUOROSCOPY TIME:  Fluoroscopy Time:  6 minutes and 10 seconds Number of Acquired Spot Images: 7 COMPARISON:  MRI 11/22/2009 2018 FINDINGS: Cannulation and opacification of the biliary system. There is a small round filling defect in the common bile duct and this is suggestive for stone. Mild dilatation of the extrahepatic biliary system. Contrast is clearly seen outside of the biliary system. Contrast is layering along the inferior aspect of the liver. The source of the bile leak may be from a inferior right intrahepatic branch. Balloon sweep was performed and non metallic biliary stent was placed in the common bile duct. IMPRESSION: Positive for a bile leak. Bile leak may be originating from a  right intrahepatic branch. Choledocholithiasis.  Stone removal and placement of biliary stent. These images were submitted for radiologic interpretation only. Please see the procedural report for the amount of contrast and the fluoroscopy time utilized. Electronically Signed   By: Richarda OverlieAdam  Henn M.D.   On: 03/26/2017 11:20   [place] Abdomen Limited Ruq  Result Date: 03/26/2017 CLINICAL DATA:  Post laparoscopic cholecystectomy complicated by bile leak. ERCP with stent placed today. Evaluate biloma. EXAM: ULTRASOUND ABDOMEN LIMITED RIGHT UPPER QUADRANT COMPARISON:  CT abdomen 1 hour prior. FINDINGS: Gallbladder: Gallbladder is surgically absent. No focal fluid collection in the gallbladder fossa. Common bile duct: Diameter: 4 mm proximally, shadowing biliary stent is partially visualized. Liver: Scattered intrahepatic linear shadowing consistent pneumobilia primarily in the left lobe, as seen on CT. No focal hepatic lesion. Hepatic echogenicity is coarsened. Portal vein is patent on color Doppler imaging with normal direction of blood flow towards the liver. Moderate perihepatic fluid, similar to CT 1 hour prior. IMPRESSION: 1. Postcholecystectomy. Biliary stent and intrahepatic pneumobilia, as seen on CT. 2. Perihepatic fluid, similar to CT allowing for differences in modality. Electronically Signed   By: Rubye OaksMelanie  Ehinger M.D.   On: 03/26/2017 18:30    Labs:  CBC: Recent Labs    03/24/17 0349 03/25/17 0635 03/27/17 0649 03/27/17 1009  WBC 9.4 8.1 12.4* 12.7*  HGB 13.1 12.3 12.5 13.3  HCT 38.4 37.2 37.1 39.1  PLT 301 359 400 448*    COAGS: Recent Labs    03/23/17  1134 03/24/17 0349  INR 1.13 1.08  APTT 30  --     BMP: Recent Labs    03/25/17 0635 03/26/17 0526 03/27/17 0649 03/27/17 1009  NA 138 138 141 139  K 3.3* 3.7 3.2* 2.8*  CL 106 105 106 106  CO2 24 24 24 23   GLUCOSE 115* 115* 93 99  BUN <5* <5* 5* <5*  CALCIUM 8.4* 8.7* 8.6* 8.7*  CREATININE 0.64 0.64 0.64 0.66  GFRNONAA  >60 >60 >60 >60  GFRAA >60 >60 >60 >60    LIVER FUNCTION TESTS: Recent Labs    03/25/17 0635 03/26/17 0526 03/27/17 0649 03/27/17 1009  BILITOT 1.5* 1.5* 1.4* 1.9*  AST 17 14* 22 30  ALT 91* 74* 58* 64*  ALKPHOS 223* 219* 225* 236*  PROT 5.4* 5.7* 5.5* 6.0*  ALBUMIN 2.6* 2.7* 2.6* 2.9*    TUMOR MARKERS: No results for input(s): AFPTM, CEA, CA199, CHROMGRNA in the last 8760 hours.  Assessment and Plan:  Perihepatic biloma. Drain to follow.  Thank you for this interesting consult.  I greatly enjoyed meeting Brenda Luna and look forward to participating in their care.  A copy of this report was sent to the requesting provider on this date.  Electronically Signed: Marcella Dunnaway, ART A, MD 03/27/2017, 12:25 PM   I spent a total of 40 Minutes  in face to face in clinical consultation, greater than 50% of which was counseling/coordinating care for biloma drain.

## 2017-03-27 NOTE — Progress Notes (Addendum)
Progress Note   Subjective  Patient underwent ERCP with stenting yesterday. Overall feeling better but does continue to have some pain. Previously 7-8/10, now 4/10. She is tolerating clear liquids.    Objective   Vital signs in last 24 hours: Temp:  [98.2 F (36.8 C)-99 F (37.2 C)] 98.2 F (36.8 C) (12/15 0451) Pulse Rate:  [74-110] 74 (12/15 0451) Resp:  [10-17] 17 (12/15 0451) BP: (113-144)/(76-96) 113/76 (12/15 0451) SpO2:  [91 %-95 %] 94 % (12/15 0451) Weight:  [94.2 kg (207 lb 10.8 oz)] 94.2 kg (207 lb 10.8 oz) (12/15 0453) Last BM Date: 03/26/17 General:    white female in NAD Heart:  Regular rate and rhythm; no murmurs Lungs: Respirations even and unlabored, lungs CTA bilaterally Abdomen:  Soft, mild epigastric TTP, nondistended.  Extremities:  Without edema. Neurologic:  Alert and oriented,  grossly normal neurologically. Psych:  Cooperative. Normal mood and affect.  Intake/Output from previous day: 12/14 0701 - 12/15 0700 In: 1902 [P.O.:802; I.V.:1000; IV Piggyback:100] Out: 0  Intake/Output this shift: Total I/O In: 382 [P.O.:382] Out: -   Lab Results: Recent Labs    03/25/17 0635 03/27/17 0649  WBC 8.1 12.4*  HGB 12.3 12.5  HCT 37.2 37.1  PLT 359 400   BMET Recent Labs    03/25/17 0635 03/26/17 0526 03/27/17 0649  NA 138 138 141  K 3.3* 3.7 3.2*  CL 106 105 106  CO2 24 24 24   GLUCOSE 115* 115* 93  BUN <5* <5* 5*  CREATININE 0.64 0.64 0.64  CALCIUM 8.4* 8.7* 8.6*   LFT Recent Labs    03/27/17 0649  PROT 5.5*  ALBUMIN 2.6*  AST 22  ALT 58*  ALKPHOS 225*  BILITOT 1.4*   PT/INR No results for input(s): LABPROT, INR in the last 72 hours.  Studies/Results: Ct Abdomen W Contrast  Result Date: 03/26/2017 CLINICAL DATA:  Evaluate for bile leak. EXAM: CT ABDOMEN WITH CONTRAST TECHNIQUE: Multidetector CT imaging of the abdomen was performed using the standard protocol following bolus administration of intravenous contrast.  CONTRAST:  75mL ISOVUE-300 IOPAMIDOL (ISOVUE-300) INJECTION 61% COMPARISON:  MRI 03/23/2017 FINDINGS: Lower chest: There is atelectasis identified within both lung bases. Hepatobiliary: Pneumobilia identified. There has been interval placement of a common bile duct stent with decompression of previous dilated intrahepatic bile ducts. Pancreas: Normal appearance of the pancreas. Spleen: The spleen is unremarkable. Adrenals/Urinary Tract: The adrenal glands are normal. Normal appearance of the kidneys. Stomach/Bowel: The stomach is normal. The small bowel loops have a normal course and caliber. Scratch set mild increase caliber of the small bowel loops measuring up to 3 cm. Normal appearance of the visualized large bowel loops. Vascular/Lymphatic: Normal appearance of the abdominal aorta. No enlarged upper abdominal lymph nodes. Other: Subhepatic fluid collection is again identified compatible with bile leak. This demonstrates mild increase in thickness when compared with previous MRI. Distance between the dome of liver and diaphragm measures 4.8 cm on the current exam, image 55 of series 6. 3.1 cm previously. Musculoskeletal: The visualized osseous structures are unremarkable. IMPRESSION: 1. Slight increase in volume of perihepatic ascites when compared with MRI from 03/23/2017. 2. Pneumobilia and decreased intrahepatic biliary dilatation status post common bile duct stenting. These results will be called to the ordering clinician or representative by the Radiologist Assistant, and communication documented in the PACS or zVision Dashboard. Electronically Signed   By: Signa Kellaylor  Stroud M.D.   On: 03/26/2017 17:31   Dg Ercp Biliary &  Pancreatic Ducts  Result Date: 03/26/2017 CLINICAL DATA:  Bile duct stone. Possible postop bile leak. Recent cholecystectomy. EXAM: ERCP TECHNIQUE: Multiple spot images obtained with the fluoroscopic device and submitted for interpretation post-procedure. FLUOROSCOPY TIME:  Fluoroscopy  Time:  6 minutes and 10 seconds Number of Acquired Spot Images: 7 COMPARISON:  MRI 11/22/2009 2018 FINDINGS: Cannulation and opacification of the biliary system. There is a small round filling defect in the common bile duct and this is suggestive for stone. Mild dilatation of the extrahepatic biliary system. Contrast is clearly seen outside of the biliary system. Contrast is layering along the inferior aspect of the liver. The source of the bile leak may be from a inferior right intrahepatic branch. Balloon sweep was performed and non metallic biliary stent was placed in the common bile duct. IMPRESSION: Positive for a bile leak. Bile leak may be originating from a right intrahepatic branch. Choledocholithiasis.  Stone removal and placement of biliary stent. These images were submitted for radiologic interpretation only. Please see the procedural report for the amount of contrast and the fluoroscopy time utilized. Electronically Signed   By: Richarda OverlieAdam  Henn M.D.   On: 03/26/2017 11:20   [place] Abdomen Limited Ruq  Result Date: 03/26/2017 CLINICAL DATA:  Post laparoscopic cholecystectomy complicated by bile leak. ERCP with stent placed today. Evaluate biloma. EXAM: ULTRASOUND ABDOMEN LIMITED RIGHT UPPER QUADRANT COMPARISON:  CT abdomen 1 hour prior. FINDINGS: Gallbladder: Gallbladder is surgically absent. No focal fluid collection in the gallbladder fossa. Common bile duct: Diameter: 4 mm proximally, shadowing biliary stent is partially visualized. Liver: Scattered intrahepatic linear shadowing consistent pneumobilia primarily in the left lobe, as seen on CT. No focal hepatic lesion. Hepatic echogenicity is coarsened. Portal vein is patent on color Doppler imaging with normal direction of blood flow towards the liver. Moderate perihepatic fluid, similar to CT 1 hour prior. IMPRESSION: 1. Postcholecystectomy. Biliary stent and intrahepatic pneumobilia, as seen on CT. 2. Perihepatic fluid, similar to CT allowing for  differences in modality. Electronically Signed   By: Rubye OaksMelanie  Ehinger M.D.   On: 03/26/2017 18:30       Assessment / Plan:    36 y/o female who is s/p lap chole on 12/3, who presented with worsening abdominal pain and elevation in liver enzymes. Found to have bile leak in gallbladder bed / duct of Luschka. She is s/p ERCP with removal of a small stone, and stent was placed given bile leak. Overall he pain is improved but not resolved. CT shows slight increase in perihepatic fluid collection. Her liver enzymes are stable, mild increase in WBC noted today.   Discussed options with patient. We can continue to observe as she is feeling better, although has some level of discomfort which would likely be improved with drain placement. Surgery feels drain placement is warranted today, will await IR consultation for this. She has been made NPO.   She will need a repeat ERCP with cholangiogram to ensure resolution of leak and stent pull in several weeks. Our office will coordinate that for her as outpatient.  We will sign off for now, please call with any additional questions.  Ileene PatrickSteven Kryslyn Helbig, MD [facility] Pager [phone]

## 2017-03-28 ENCOUNTER — Encounter (HOSPITAL_COMMUNITY): Payer: Self-pay | Admitting: Gastroenterology

## 2017-03-28 LAB — COMPREHENSIVE METABOLIC PANEL
ALT: 54 U/L (ref 14–54)
AST: 33 U/L (ref 15–41)
Albumin: 2.8 g/dL — ABNORMAL LOW (ref 3.5–5.0)
Alkaline Phosphatase: 216 U/L — ABNORMAL HIGH (ref 38–126)
Anion gap: 10 (ref 5–15)
BUN: <5 mg/dL — ABNORMAL LOW (ref 6–20)
CO2: 24 mmol/L (ref 22–32)
Calcium: 8.8 mg/dL — ABNORMAL LOW (ref 8.9–10.3)
Chloride: 104 mmol/L (ref 101–111)
Creatinine, Ser: 0.65 mg/dL (ref 0.44–1.00)
GFR calc Af Amer: >60 mL/min (ref >60)
GFR calc non Af Amer: >60 mL/min (ref >60)
Glucose, Bld: 89 mg/dL (ref 65–99)
Potassium: 3.8 mmol/L (ref 3.5–5.1)
Sodium: 138 mmol/L (ref 135–145)
Total Bilirubin: 1.4 mg/dL — ABNORMAL HIGH (ref 0.3–1.2)
Total Protein: 6.1 g/dL — ABNORMAL LOW (ref 6.5–8.1)

## 2017-03-28 LAB — CBC
HCT: 38.6 % (ref 36.0–46.0)
Hemoglobin: 12.9 g/dL (ref 12.0–15.0)
MCH: 30.4 pg (ref 26.0–34.0)
MCHC: 33.4 g/dL (ref 30.0–36.0)
MCV: 90.8 fL (ref 78.0–100.0)
Platelets: 445 10*3/uL — ABNORMAL HIGH (ref 150–400)
RBC: 4.25 MIL/uL (ref 3.87–5.11)
RDW: 13.8 % (ref 11.5–15.5)
WBC: 11.5 10*3/uL — ABNORMAL HIGH (ref 4.0–10.5)

## 2017-03-28 NOTE — Progress Notes (Signed)
PROGRESS NOTE   Brenda LeveringJennifer Luna  WUJ:[id number]    DOB: 03/10/1981    DOA: 03/23/2017  PCP: Kendra Opitzole, Dawn Watkins, NP   I have briefly reviewed patients previous medical records in [facility].  Brief Narrative:  36 year old female patient with PMH of PCOS underwent elective laparoscopic cholecystectomy at [facility] on 12/3, did well for the first couple days then noted progressively worsening abdominal pain and presented to [facility]. CT abdomen there showed possible retained stone, possible cholangitis but no pancreatitis. MRCP or ERCP were recommended but not available at [facility] and hence [facility]. MRCP >HIDA scan confirmed biliary leak. [facility] and [facility] consulted. S/p ERCP to stent bile duct on 02/24/17 and Bilioma drain by IR on 12/15.   Assessment & Plan:   Active Problems:   History of biliary colic   UTI (urinary tract infection)   Abdominal pain   Transaminitis   Cholangitis   Bile duct stone   Bile leak   1. Bile leak status post laparoscopic cholecystectomy 12/4: MRCP results appreciated. HIDA scan confirmed bile leak. She was treated with IV antibiotics and bowel rest. [facility] assisting. General surgery on board and hopeful that the stent will work and no surgery will be required. Underwent ERCP 12/14 which confirmed bile leak. She underwent biliary sphincterotomy, plastic stent placement in the CBD. CT abdomen was repeated which showed slight increase in volume of perihepatic ascites compared from MRI 12/11. As per [facility] recommendation, IR placed right perihepatic bilioma drain 12/15. Patient will need repeat ERCP for stent removal and confirmation of bile leak resolution in several weeks. RUQ JP drain has put out 535 ML so far but decreasing, Gram stain negative. As discussed with Dr. Derrell LollingIngram, surgery, advanced diet as tolerated today, possible home 12/17 with drain (follow-up and drain clinic) and with Dr. Abigail Miyamotoouglas Blackman in  1 week. 2. Hypokalemia: Replaced 3. Hypomagnesemia: Replaced 4. History of PCOS    DVT prophylaxis: SCD's Code Status: Full Family Communication: Discussed in the presence of a friend at bedside. Disposition: DC home when medically improved, likely 12/17   Consultants:  [facility] General surgery  IR  Procedures:  ERCP 12/14 biliary sphincterotomy, plastic stent placement in the CBD. Percutaneous right perihepatic bilioma drain 12/15  Antimicrobials:  IV ceftriaxone and metronidazole    Subjective: RUQ abdominal pain better. Getting ready to try soft diet. No other complaints reported. Decreased drainage from RUQ JP drain.  ROS: As above  Objective:  Vitals:   03/27/17 2117 03/28/17 0500 03/28/17 0600 03/28/17 1410  BP: 128/71  109/73 120/73  Pulse: 92  80 91  Resp: 18  17 18   Temp: 98.7 F (37.1 C)  98 F (36.7 C) 98.4 F (36.9 C)  TempSrc: Oral  Oral Oral  SpO2: 100%  97% 97%  Weight:  94.4 kg (208 lb 1.8 oz)    Height:        Examination:  General exam: Pleasant young female sitting up comfortably in chair. Appears in no distress. Respiratory system: Clear to auscultation. Respiratory effort normal. Stable. Cardiovascular system: S1 & S2 heard, RRR. No JVD, murmurs, rubs, gallops or clicks. No pedal edema. Stable without change. Gastrointestinal system: Abdomen is nondistended, soft and nontender. RUQ JP drain with minimal brown color liquid. Epigastric laparoscopic incision site without pus drainage today. Other sites look okay. Central nervous system: Alert and oriented. No focal neurological deficits. Stable without change. Extremities: Symmetric 5 x 5 power. Skin: No  rashes, lesions or ulcers Psychiatry: Judgement and insight appear normal. Mood & affect appropriate.     Data Reviewed: I have personally reviewed following labs and imaging studies  CBC: Recent Labs  Lab 03/23/17 1134 03/24/17 0349 03/25/17 0635 03/27/17 0649 03/27/17 1009  03/28/17 0505  WBC 11.3* 9.4 8.1 12.4* 12.7* 11.5*  NEUTROABS 8.7*  --  5.0  --   --   --   HGB 13.1 13.1 12.3 12.5 13.3 12.9  HCT 40.2 38.4 37.2 37.1 39.1 38.6  MCV 92.6 90.6 91.0 90.3 90.7 90.8  PLT 274 301 359 400 448* 445*   Basic Metabolic Panel: Recent Labs  Lab 03/25/17 0635 03/26/17 0526 03/27/17 0649 03/27/17 1009 03/28/17 0505  NA 138 138 141 139 138  K 3.3* 3.7 3.2* 2.8* 3.8  CL 106 105 106 106 104  CO2 24 24 24 23 24   GLUCOSE 115* 115* 93 99 89  BUN <5* <5* 5* <5* <5*  CREATININE 0.64 0.64 0.64 0.66 0.65  CALCIUM 8.4* 8.7* 8.6* 8.7* 8.8*  MG  --  1.6* 2.0  --   --    Liver Function Tests: Recent Labs  Lab 03/25/17 0635 03/26/17 0526 03/27/17 0649 03/27/17 1009 03/28/17 0505  AST 17 14* 22 30 33  ALT 91* 74* 58* 64* 54  ALKPHOS 223* 219* 225* 236* 216*  BILITOT 1.5* 1.5* 1.4* 1.9* 1.4*  PROT 5.4* 5.7* 5.5* 6.0* 6.1*  ALBUMIN 2.6* 2.7* 2.6* 2.9* 2.8*   Coagulation Profile: Recent Labs  Lab 03/23/17 1134 03/24/17 0349  INR 1.13 1.08   HbA1C: No results for input(s): HGBA1C in the last 72 hours. CBG: Recent Labs  Lab 03/23/17 1345  GLUCAP 78    Recent Results (from the past 240 hour(s))  Aerobic/Anaerobic Culture (surgical/deep wound)     Status: None (Preliminary result)   Collection Time: 03/27/17  7:15 PM  Result Value Ref Range Status   Specimen Description ABDOMEN ABSCESS  Final   Special Requests NONE  Final   Gram Stain   Final    RARE WBC PRESENT, PREDOMINANTLY PMN NO ORGANISMS SEEN    Culture NO GROWTH < 24 HOURS  Final   Report Status PENDING  Incomplete         Radiology Studies: Ct Abdomen W Contrast  Result Date: 03/26/2017 CLINICAL DATA:  Evaluate for bile leak. EXAM: CT ABDOMEN WITH CONTRAST TECHNIQUE: Multidetector CT imaging of the abdomen was performed using the standard protocol following bolus administration of intravenous contrast. CONTRAST:  75mL ISOVUE-300 IOPAMIDOL (ISOVUE-300) INJECTION 61% COMPARISON:   MRI 03/23/2017 FINDINGS: Lower chest: There is atelectasis identified within both lung bases. Hepatobiliary: Pneumobilia identified. There has been interval placement of a common bile duct stent with decompression of previous dilated intrahepatic bile ducts. Pancreas: Normal appearance of the pancreas. Spleen: The spleen is unremarkable. Adrenals/Urinary Tract: The adrenal glands are normal. Normal appearance of the kidneys. Stomach/Bowel: The stomach is normal. The small bowel loops have a normal course and caliber. Scratch set mild increase caliber of the small bowel loops measuring up to 3 cm. Normal appearance of the visualized large bowel loops. Vascular/Lymphatic: Normal appearance of the abdominal aorta. No enlarged upper abdominal lymph nodes. Other: Subhepatic fluid collection is again identified compatible with bile leak. This demonstrates mild increase in thickness when compared with previous MRI. Distance between the dome of liver and diaphragm measures 4.8 cm on the current exam, image 55 of series 6. 3.1 cm previously. Musculoskeletal: The visualized osseous structures are  unremarkable. IMPRESSION: 1. Slight increase in volume of perihepatic ascites when compared with MRI from 03/23/2017. 2. Pneumobilia and decreased intrahepatic biliary dilatation status post common bile duct stenting. These results will be called to the ordering clinician or representative by the Radiologist Assistant, and communication documented in the PACS or zVision Dashboard. Electronically Signed   By: Signa Kell M.D.   On: 03/26/2017 17:31   US Abdomen Limited Ruq  Result Date: 03/26/2017 CLINICAL DATA:  Post laparoscopic cholecystectomy complicated by bile leak. ERCP with stent placed today. Evaluate biloma. EXAM: ULTRASOUND ABDOMEN LIMITED RIGHT UPPER QUADRANT COMPARISON:  CT abdomen 1 hour prior. FINDINGS: Gallbladder: Gallbladder is surgically absent. No focal fluid collection in the gallbladder fossa. Common bile  duct: Diameter: 4 mm proximally, shadowing biliary stent is partially visualized. Liver: Scattered intrahepatic linear shadowing consistent pneumobilia primarily in the left lobe, as seen on CT. No focal hepatic lesion. Hepatic echogenicity is coarsened. Portal vein is patent on color Doppler imaging with normal direction of blood flow towards the liver. Moderate perihepatic fluid, similar to CT 1 hour prior. IMPRESSION: 1. Postcholecystectomy. Biliary stent and intrahepatic pneumobilia, as seen on CT. 2. Perihepatic fluid, similar to CT allowing for differences in modality. Electronically Signed   By: Rubye Oaks M.D.   On: 03/26/2017 18:30        Scheduled Meds: . sodium chloride flush  5 mL Intravenous Q8H   Continuous Infusions: . cefTRIAXone (ROCEPHIN)  IV 2 g (03/28/17 1113)  . lactated ringers 1,000 mL with potassium chloride 30 mEq infusion 125 mL/hr at 03/28/17 0420     LOS: 5 days     Marcellus Scott, MD, FACP, [facility]. [facility] Pager [phone]  If 7PM-7AM, please contact night-coverage [email or web address] Password [email or web address] 03/28/2017, 2:18 PM

## 2017-03-28 NOTE — Progress Notes (Signed)
IV infiltrated, notified MD. It is ok to leave out as long as patient is eating and drinking and pain controlled by oral medications.

## 2017-03-28 NOTE — Progress Notes (Signed)
Referring Physician(s): Ingram,H  Supervising Physician: Jolaine ClickHoss, Arthur  Patient Status:  [facility]  Chief Complaint:  Biloma, abdominal pain  Subjective: Pt doing ok this am; some soreness at drain site as expected; currently without N/V or resp difficulties   Allergies: Wellbutrin [bupropion]  Medications: Prior to Admission medications   Not on File     Vital Signs: BP 109/73 (BP Location: Left Arm)   Pulse 80   Temp 98 F (36.7 C) (Oral)   Resp 17   Ht 5' 7.75" (1.721 m)   Wt 208 lb 1.8 oz (94.4 kg)   LMP 02/21/2017 (Approximate) Comment: irregular due to PCOS  SpO2 97%   BMI 31.88 kg/m   Physical Exam  Perihepatic drain intact, insertion site ok, mildly tender, output 515 cc yesterday, 20 cc so far today light green /bilious fluid; cx pend Imaging: Ct Abdomen W Contrast  Result Date: 03/26/2017 CLINICAL DATA:  Evaluate for bile leak. EXAM: CT ABDOMEN WITH CONTRAST TECHNIQUE: Multidetector CT imaging of the abdomen was performed using the standard protocol following bolus administration of intravenous contrast. CONTRAST:  75mL ISOVUE-300 IOPAMIDOL (ISOVUE-300) INJECTION 61% COMPARISON:  MRI 03/23/2017 FINDINGS: Lower chest: There is atelectasis identified within both lung bases. Hepatobiliary: Pneumobilia identified. There has been interval placement of a common bile duct stent with decompression of previous dilated intrahepatic bile ducts. Pancreas: Normal appearance of the pancreas. Spleen: The spleen is unremarkable. Adrenals/Urinary Tract: The adrenal glands are normal. Normal appearance of the kidneys. Stomach/Bowel: The stomach is normal. The small bowel loops have a normal course and caliber. Scratch set mild increase caliber of the small bowel loops measuring up to 3 cm. Normal appearance of the visualized large bowel loops. Vascular/Lymphatic: Normal appearance of the abdominal aorta. No enlarged upper abdominal lymph nodes. Other: Subhepatic fluid  collection is again identified compatible with bile leak. This demonstrates mild increase in thickness when compared with previous MRI. Distance between the dome of liver and diaphragm measures 4.8 cm on the current exam, image 55 of series 6. 3.1 cm previously. Musculoskeletal: The visualized osseous structures are unremarkable. IMPRESSION: 1. Slight increase in volume of perihepatic ascites when compared with MRI from 03/23/2017. 2. Pneumobilia and decreased intrahepatic biliary dilatation status post common bile duct stenting. These results will be called to the ordering clinician or representative by the Radiologist Assistant, and communication documented in the PACS or zVision Dashboard. Electronically Signed   By: Signa Kellaylor  Stroud M.D.   On: 03/26/2017 17:31   Nm Hepatobiliary Liver Func  Result Date: 03/24/2017 CLINICAL DATA:  36 year old female postoperative day 9 coli cystectomy. Abdomen MRI yesterday with increasing perihepatic free fluid suspicious for bile leak. EXAM: NUCLEAR MEDICINE HEPATOBILIARY IMAGING TECHNIQUE: Sequential images of the abdomen were obtained out to 60 minutes following intravenous administration of radiopharmaceutical. RADIOPHARMACEUTICALS:  5  mCi Tc-6113m  Choletec IV COMPARISON:  Abdomen MRI 03/23/2017 and earlier. FINDINGS: Initially there is prompt radiotracer uptake by the liver and clearance of the blood pool. CBD activity is visible by 20 minutes. Small bowel activity visible by 20 minutes and increases over the course of the study. At the same time there is subtle abnormal radiotracer accumulation occurring along the inferior right liver tip (Image 13. Progressive radiotracer activity then along the right lateral and superior perihepatic space, such that after 1 hour the more concentrated perihepatic radiotracer activity creates a negative silhouette of the hepatic contour (image 60). This exam was reviewed with Dr. Amil AmenEdmunds. IMPRESSION: 1. Positive for bile  leak with  perihepatic accumulation of the excreted hepatic radiotracer corresponding to the perihepatic fluid collection on MRI yesterday. This pattern suggests a subcapsular bile leak. 2. The CBD is patent. Electronically Signed   By: Odessa FlemingH  Hall M.D.   On: 03/24/2017 15:14   Dg Ercp Biliary & Pancreatic Ducts  Result Date: 03/26/2017 CLINICAL DATA:  Bile duct stone. Possible postop bile leak. Recent cholecystectomy. EXAM: ERCP TECHNIQUE: Multiple spot images obtained with the fluoroscopic device and submitted for interpretation post-procedure. FLUOROSCOPY TIME:  Fluoroscopy Time:  6 minutes and 10 seconds Number of Acquired Spot Images: 7 COMPARISON:  MRI 11/22/2009 2018 FINDINGS: Cannulation and opacification of the biliary system. There is a small round filling defect in the common bile duct and this is suggestive for stone. Mild dilatation of the extrahepatic biliary system. Contrast is clearly seen outside of the biliary system. Contrast is layering along the inferior aspect of the liver. The source of the bile leak may be from a inferior right intrahepatic branch. Balloon sweep was performed and non metallic biliary stent was placed in the common bile duct. IMPRESSION: Positive for a bile leak. Bile leak may be originating from a right intrahepatic branch. Choledocholithiasis.  Stone removal and placement of biliary stent. These images were submitted for radiologic interpretation only. Please see the procedural report for the amount of contrast and the fluoroscopy time utilized. Electronically Signed   By: Richarda OverlieAdam  Henn M.D.   On: 03/26/2017 11:20   [place] Abdomen Limited Ruq  Result Date: 03/26/2017 CLINICAL DATA:  Post laparoscopic cholecystectomy complicated by bile leak. ERCP with stent placed today. Evaluate biloma. EXAM: ULTRASOUND ABDOMEN LIMITED RIGHT UPPER QUADRANT COMPARISON:  CT abdomen 1 hour prior. FINDINGS: Gallbladder: Gallbladder is surgically absent. No focal fluid collection in the gallbladder fossa.  Common bile duct: Diameter: 4 mm proximally, shadowing biliary stent is partially visualized. Liver: Scattered intrahepatic linear shadowing consistent pneumobilia primarily in the left lobe, as seen on CT. No focal hepatic lesion. Hepatic echogenicity is coarsened. Portal vein is patent on color Doppler imaging with normal direction of blood flow towards the liver. Moderate perihepatic fluid, similar to CT 1 hour prior. IMPRESSION: 1. Postcholecystectomy. Biliary stent and intrahepatic pneumobilia, as seen on CT. 2. Perihepatic fluid, similar to CT allowing for differences in modality. Electronically Signed   By: Rubye OaksMelanie  Ehinger M.D.   On: 03/26/2017 18:30    Labs:  CBC: Recent Labs    03/25/17 0635 03/27/17 0649 03/27/17 1009 03/28/17 0505  WBC 8.1 12.4* 12.7* 11.5*  HGB 12.3 12.5 13.3 12.9  HCT 37.2 37.1 39.1 38.6  PLT 359 400 448* 445*    COAGS: Recent Labs    03/23/17 1134 03/24/17 0349  INR 1.13 1.08  APTT 30  --     BMP: Recent Labs    03/26/17 0526 03/27/17 0649 03/27/17 1009 03/28/17 0505  NA 138 141 139 138  K 3.7 3.2* 2.8* 3.8  CL 105 106 106 104  CO2 24 24 23 24   GLUCOSE 115* 93 99 89  BUN <5* 5* <5* <5*  CALCIUM 8.7* 8.6* 8.7* 8.8*  CREATININE 0.64 0.64 0.66 0.65  GFRNONAA >60 >60 >60 >60  GFRAA >60 >60 >60 >60    LIVER FUNCTION TESTS: Recent Labs    03/26/17 0526 03/27/17 0649 03/27/17 1009 03/28/17 0505  BILITOT 1.5* 1.4* 1.9* 1.4*  AST 14* 22 30 33  ALT 74* 58* 64* 54  ALKPHOS 219* 225* 236* 216*  PROT 5.7* 5.5* 6.0* 6.1*  ALBUMIN 2.7* 2.6* 2.9* 2.8*    Assessment and Plan: S/p cholecystectomy 12/3 with subsequent bile leak, ERCP/stent 12/14, right perihepatic biloma drain 12/15; afebrile; WBC 11.5(12.7), hgb 12.9, creat nl; t bili 1.4(1.9), bile cx pend; cont current tx, watch output and once minimal over 48 hr period obtain f/u CT before removing drain  Electronically Signed: D. Jeananne Rama, PA-C 03/28/2017, 8:54 AM   I spent a  total of 15 minutes at the the patient's bedside AND on the patient's hospital floor or unit, greater than 50% of which was counseling/coordinating care for perihepatic drain    Patient ID: Brenda Luna, female   DOB: [birth year], 36 y.o.   MRN: [id number]

## 2017-03-28 NOTE — Progress Notes (Signed)
2 Days Post-Op  Subjective: Doing well.  Tolerating clear liquids.  Having some pain from drain site.  Perihepatic biloma successfully drained in IR yesterday 535 mL out so far, but rapidly declining in volume. Will need to arrange follow-up in drain clinic  Bile Gram stain negative.  Cultures pending. Remains on Rocephin.  Lab work shows WBC 11,500.  Alkaline phosphatase 216.  Total bilirubin 1.4.  LFTs otherwise normal.  Objective: Vital signs in last 24 hours: Temp:  [97.5 F (36.4 C)-98.7 F (37.1 C)] 98 F (36.7 C) (12/16 0600) Pulse Rate:  [68-92] 80 (12/16 0600) Resp:  [14-22] 17 (12/16 0600) BP: (100-132)/(62-79) 109/73 (12/16 0600) SpO2:  [94 %-100 %] 97 % (12/16 0600) Weight:  [94.4 kg (208 lb 1.8 oz)] 94.4 kg (208 lb 1.8 oz) (12/16 0500) Last BM Date: 03/26/17  Intake/Output from previous day: 12/15 0701 - 12/16 0700 In: 3502.3 [P.O.:1316; I.V.:2181.3] Out: 1465 [Urine:950; Drains:515] Intake/Output this shift: Total I/O In: 390 [P.O.:390] Out: 720 [Urine:700; Drains:20]  PE General appearance: alert.  Cooperative.  Minimal distress.  Mental status normal Resp: clear to auscultation bilaterally GI: oft.  Nondistended.  Wounds clean.  Minimal tenderness.  No guarding. Right upper quadrant drain with clear bilious drainage.minimal volume involved. Extremities: extremities normal, atraumatic, no cyanosis or edema     Lab Results:  Recent Labs    03/27/17 1009 03/28/17 0505  WBC 12.7* 11.5*  HGB 13.3 12.9  HCT 39.1 38.6  PLT 448* 445*   BMET Recent Labs    03/27/17 1009 03/28/17 0505  NA 139 138  K 2.8* 3.8  CL 106 104  CO2 23 24  GLUCOSE 99 89  BUN <5* <5*  CREATININE 0.66 0.65  CALCIUM 8.7* 8.8*   PT/INR No results for input(s): LABPROT, INR in the last 72 hours. ABG No results for input(s): PHART, HCO3 in the last 72 hours.  Invalid input(s): PCO2, PO2  Studies/Results: Ct Abdomen W Contrast  Result Date: 03/26/2017 CLINICAL  DATA:  Evaluate for bile leak. EXAM: CT ABDOMEN WITH CONTRAST TECHNIQUE: Multidetector CT imaging of the abdomen was performed using the standard protocol following bolus administration of intravenous contrast. CONTRAST:  75mL ISOVUE-300 IOPAMIDOL (ISOVUE-300) INJECTION 61% COMPARISON:  MRI 03/23/2017 FINDINGS: Lower chest: There is atelectasis identified within both lung bases. Hepatobiliary: Pneumobilia identified. There has been interval placement of a common bile duct stent with decompression of previous dilated intrahepatic bile ducts. Pancreas: Normal appearance of the pancreas. Spleen: The spleen is unremarkable. Adrenals/Urinary Tract: The adrenal glands are normal. Normal appearance of the kidneys. Stomach/Bowel: The stomach is normal. The small bowel loops have a normal course and caliber. Scratch set mild increase caliber of the small bowel loops measuring up to 3 cm. Normal appearance of the visualized large bowel loops. Vascular/Lymphatic: Normal appearance of the abdominal aorta. No enlarged upper abdominal lymph nodes. Other: Subhepatic fluid collection is again identified compatible with bile leak. This demonstrates mild increase in thickness when compared with previous MRI. Distance between the dome of liver and diaphragm measures 4.8 cm on the current exam, image 55 of series 6. 3.1 cm previously. Musculoskeletal: The visualized osseous structures are unremarkable. IMPRESSION: 1. Slight increase in volume of perihepatic ascites when compared with MRI from 03/23/2017. 2. Pneumobilia and decreased intrahepatic biliary dilatation status post common bile duct stenting. These results will be called to the ordering clinician or representative by the Radiologist Assistant, and communication documented in the PACS or zVision Dashboard. Electronically Signed   By:  Signa Kellaylor  Stroud M.D.   On: 03/26/2017 17:31   Dg Ercp Biliary & Pancreatic Ducts  Result Date: 03/26/2017 CLINICAL DATA:  Bile duct stone.  Possible postop bile leak. Recent cholecystectomy. EXAM: ERCP TECHNIQUE: Multiple spot images obtained with the fluoroscopic device and submitted for interpretation post-procedure. FLUOROSCOPY TIME:  Fluoroscopy Time:  6 minutes and 10 seconds Number of Acquired Spot Images: 7 COMPARISON:  MRI 11/22/2009 2018 FINDINGS: Cannulation and opacification of the biliary system. There is a small round filling defect in the common bile duct and this is suggestive for stone. Mild dilatation of the extrahepatic biliary system. Contrast is clearly seen outside of the biliary system. Contrast is layering along the inferior aspect of the liver. The source of the bile leak may be from a inferior right intrahepatic branch. Balloon sweep was performed and non metallic biliary stent was placed in the common bile duct. IMPRESSION: Positive for a bile leak. Bile leak may be originating from a right intrahepatic branch. Choledocholithiasis.  Stone removal and placement of biliary stent. These images were submitted for radiologic interpretation only. Please see the procedural report for the amount of contrast and the fluoroscopy time utilized. Electronically Signed   By: Richarda OverlieAdam  Henn M.D.   On: 03/26/2017 11:20   [place] Abdomen Limited Ruq  Result Date: 03/26/2017 CLINICAL DATA:  Post laparoscopic cholecystectomy complicated by bile leak. ERCP with stent placed today. Evaluate biloma. EXAM: ULTRASOUND ABDOMEN LIMITED RIGHT UPPER QUADRANT COMPARISON:  CT abdomen 1 hour prior. FINDINGS: Gallbladder: Gallbladder is surgically absent. No focal fluid collection in the gallbladder fossa. Common bile duct: Diameter: 4 mm proximally, shadowing biliary stent is partially visualized. Liver: Scattered intrahepatic linear shadowing consistent pneumobilia primarily in the left lobe, as seen on CT. No focal hepatic lesion. Hepatic echogenicity is coarsened. Portal vein is patent on color Doppler imaging with normal direction of blood flow towards the  liver. Moderate perihepatic fluid, similar to CT 1 hour prior. IMPRESSION: 1. Postcholecystectomy. Biliary stent and intrahepatic pneumobilia, as seen on CT. 2. Perihepatic fluid, similar to CT allowing for differences in modality. Electronically Signed   By: Rubye OaksMelanie  Ehinger M.D.   On: 03/26/2017 18:30    Anti-infectives: Anti-infectives (From admission, onward)   Start     Dose/Rate Route Frequency Ordered Stop   03/23/17 1130  metroNIDAZOLE (FLAGYL) IVPB 500 mg  Status:  Discontinued     500 mg 100 mL/hr over 60 Minutes Intravenous Every 8 hours 03/23/17 1109 03/26/17 1105   03/23/17 1130  cefTRIAXone (ROCEPHIN) 2 g in dextrose 5 % 50 mL IVPB     2 g 100 mL/hr over 30 Minutes Intravenous Every 24 hours 03/23/17 1112        Assessment/Plan: s/p Procedure(s): ENDOSCOPIC RETROGRADE CHOLANGIOPANCREATOGRAPHY (ERCP)  Bile leak status post laparoscopic cholecystectomy 12 /4, [place]. -ERCP 12/14 shows leak from right hepatic duct which is almost certainly duct of L[place]chka in gallbladder bed.  This should stop now that the biliary tree is decompressed  Perihepatic fluid collection.  This is a large biloma collection and she is still symptomatic. -successful percutaneous drainage of biloma performed in radiology yesterday -she will need the drainage catheter for several days, and I explained this to her -continue Rocephin   Plan: -Continue IV antibiotics today -advance to soft diet -Possibly home tomorrow with drain -drain management per interventional radiology drain clinic.  This will need to be arranged -[facility] [facility]  LOS: 5 days    Ernestene Mention 03/28/2017

## 2017-03-28 NOTE — Progress Notes (Signed)
Showed patient's mother how to change dressings and care for JP drain. Gave patient information sheet on JP drain. Patient and patient's mother verbalized understanding of the drain care and dressing changes.

## 2017-03-29 ENCOUNTER — Telehealth: Payer: Self-pay | Admitting: Gastroenterology

## 2017-03-29 DIAGNOSIS — K7689 Other specified diseases of liver: Secondary | ICD-10-CM

## 2017-03-29 LAB — GLUCOSE, CAPILLARY: Glucose-Capillary: 94 mg/dL (ref 65–99)

## 2017-03-29 MED ORDER — DOCUSATE SODIUM 100 MG PO CAPS: 100.0000 mg | ORAL_CAPSULE | Freq: Every day | ORAL | 0 refills | Status: DC

## 2017-03-29 MED ORDER — DOCUSATE SODIUM 100 MG PO CAPS
100.0000 mg | ORAL_CAPSULE | Freq: Every day | ORAL | Status: DC
  Administered 2017-03-29: 100 mg via ORAL
  Filled 2017-03-29: qty 1

## 2017-03-29 MED ORDER — POLYETHYLENE GLYCOL 3350 17 G PO PACK
17.0000 g | PACK | Freq: Every day | ORAL | Status: DC
  Administered 2017-03-29: 17 g via ORAL
  Filled 2017-03-29: qty 1

## 2017-03-29 MED ORDER — OXYCODONE-ACETAMINOPHEN 5-325 MG PO TABS: 1.0000 | ORAL_TABLET | Freq: Four times a day (QID) | ORAL | 0 refills | Status: DC | PRN

## 2017-03-29 MED ORDER — POLYETHYLENE GLYCOL 3350 17 G PO PACK: 17.0000 g | PACK | Freq: Every day | ORAL | 0 refills | Status: DC

## 2017-03-29 NOTE — Discharge Instructions (Addendum)
Management of abdominal drain:   Must flush daily with 5cc sterile saline Keep clean and dry Do not submerge Record output daily   Additional discharge instructions  Please get your medications reviewed and adjusted by your Primary MD.  Please request your Primary MD to go over all Hospital Tests and Procedure/Radiological results at the follow up, please get all Hospital records sent to your Prim MD by signing hospital release before you go home.  If you had Pneumonia of Lung problems at the Hospital: Please get a 2 view Chest X ray done in 6-8 weeks after hospital discharge or sooner if instructed by your Primary MD.  If you have Congestive Heart Failure: Please call your Cardiologist or Primary MD anytime you have any of the following symptoms:  1) 3 pound weight gain in 24 hours or 5 pounds in 1 week  2) shortness of breath, with or without a dry hacking cough  3) swelling in the hands, feet or stomach  4) if you have to sleep on extra pillows at night in order to breathe  Follow cardiac low salt diet and 1.5 lit/day fluid restriction.  If you have diabetes Accuchecks 4 times/day, Once in AM empty stomach and then before each meal. Log in all results and show them to your primary doctor at your next visit. If any glucose reading is under 80 or above 300 call your primary MD immediately.  If you have Seizure/Convulsions/Epilepsy: Please do not drive, operate heavy machinery, participate in activities at heights or participate in high speed sports until you have seen by Primary MD or a Neurologist and advised to do so again.  If you had Gastrointestinal Bleeding: Please ask your Primary MD to check a complete blood count within one week of discharge or at your next visit. Your endoscopic/colonoscopic biopsies that are pending at the time of discharge, will also need to followed by your Primary MD.  Get Medicines reviewed and adjusted. Please take all your medications with you  for your next visit with your Primary MD  Please request your Primary MD to go over all hospital tests and procedure/radiological results at the follow up, please ask your Primary MD to get all Hospital records sent to his/her office.  If you experience worsening of your admission symptoms, develop shortness of breath, life threatening emergency, suicidal or homicidal thoughts you must seek medical attention immediately by calling 911 or calling your MD immediately  if symptoms less severe.  You must read complete instructions/literature along with all the possible adverse reactions/side effects for all the Medicines you take and that have been prescribed to you. Take any new Medicines after you have completely understood and accpet all the possible adverse reactions/side effects.   Do not drive or operate heavy machinery when taking Pain medications.   Do not take more than prescribed Pain, Sleep and Anxiety Medications  Special Instructions: If you have smoked or chewed Tobacco  in the last 2 yrs please stop smoking, stop any regular Alcohol  and or any Recreational drug use.  Wear Seat belts while driving.  Please note You were cared for by a hospitalist during your hospital stay. If you have any questions about your discharge medications or the care you received while you were in the hospital after you are discharged, you can call the unit and asked to speak with the hospitalist on call if the hospitalist that took care of you is not available. Once you are discharged, your primary care  physician will handle any further medical issues. Please note that NO REFILLS for any discharge medications will be authorized once you are discharged, as it is imperative that you return to your primary care physician (or establish a relationship with a primary care physician if you do not have one) for your aftercare needs so that they can reassess your need for medications and monitor your lab values.  You  can reach the hospitalist office at phone [phone] or fax [phone]   If you do not have a primary care physician, you can call [phone] for a physician referral.

## 2017-03-29 NOTE — Progress Notes (Signed)
Patient ID: Jeb LeveringJennifer Hollman, female   DOB: 08/20/1980, 36 y.o.   MRN: [id number]   Biloma drain placed 12/15 R perihepatic biloma drain 10 Fr Bilious fluid EBL 0 Comp 0  Dr Waymon AmatoHongalgi to DC pt today  Will plan to see pt in IR OP Drain Clinic 10 days Must flush daily with 5cc sterile saline Keep clean and dry Do not submerge Record output daily  Order for evaluation in place Pt will hear from scheduler for time and date

## 2017-03-29 NOTE — Telephone Encounter (Signed)
Patient is scheduled for January 16th starting at 1:15 pm. I will mail the date/time/prep instructions to patient's house.

## 2017-03-29 NOTE — Discharge Summary (Signed)
Physician Discharge Summary  Brenda Luna NWG:[id number] DOB: 1980-12-23  PCP: Kendra Opitz, NP  Admit date: 03/23/2017 Discharge date: 03/29/2017  Recommendations for Outpatient Follow-up:  1. Hulan Fray, NP/PCP in 4 days with repeat labs (CBC & CMP). 2. Dr. Abigail Miyamoto, General Surgery in 1 week. 3. Dr. Maryclare Bean, IR/drain clinic in 10 days. Office will call patient with appointment. 4. Amada Jupiter, [facility]  Home Health: None Equipment/Devices: None    Discharge Condition: Improved and stable.  CODE STATUS: Full  Diet recommendation: Regular diet.  Discharge Diagnoses:  Active Problems:   History of biliary colic   UTI (urinary tract infection)   Abdominal pain   Transaminitis   Cholangitis   Bile duct stone   Bile leak   Brief Summary: 36 year old female patient with PMH of PCOS underwent elective laparoscopic cholecystectomy at [facility] on 12/3, did well for the first couple days then noted progressively worsening abdominal pain and presented to [facility]. CT abdomen there showed possible retained stone, possible cholangitis but no pancreatitis. MRCP or ERCP were recommended but not available at [facility] and hence transferred to [facility]. MRCP >HIDA scan confirmed biliary leak. [facility] and [facility] consulted. S/p ERCP to stent bile duct on 02/24/17 and Bilioma drain by IR on 12/15.   Assessment & Plan:    1. Bile leak status post laparoscopic cholecystectomy 12/4: MRCP results appreciated. HIDA scan confirmed bile leak. She was treated with IV antibiotics and bowel rest. [facility] were consulted. Underwent ERCP 12/14 which confirmed bile leak. She underwent biliary sphincterotomy, plastic stent placement in the CBD. CT abdomen was repeated which showed slight increase in volume of perihepatic ascites compared from MRI 12/11. As per [facility] recommendation, IR placed right perihepatic bilioma drain 12/15. Patient  will need repeat ERCP for stent removal and confirmation of bile leak resolution in several weeks. RUQ JP drain has put out 535 ML after first day but substantially reduced thereafter, Gram stain negative and culture negative to date. Clinically improved. General surgery has cleared her for discharge home without antibiotics and outpatient follow-up with them in 1 week. I discussed with IR who will see her again in the drain clinic for further evaluation and management. 2. Perihepatic fluid collection/large Bilioma: Management as above. 3. Hypokalemia: Replaced 4. Hypomagnesemia: Replaced 5. History of PCOS    Consultants:  [facility] General surgery  IR  Procedures:  ERCP 12/14 biliary sphincterotomy, plastic stent placement in the CBD. Percutaneous right perihepatic bilioma drain 12/15    Discharge Instructions  Discharge Instructions    Activity as tolerated - No restrictions   Complete by:  As directed    Call MD for:  difficulty breathing, headache or visual disturbances   Complete by:  As directed    Call MD for:  extreme fatigue   Complete by:  As directed    Call MD for:  persistant dizziness or light-headedness   Complete by:  As directed    Call MD for:  persistant nausea and vomiting   Complete by:  As directed    Call MD for:  redness, tenderness, or signs of infection (pain, swelling, redness, odor or green/yellow discharge around incision site)   Complete by:  As directed    Call MD for:  severe uncontrolled pain   Complete by:  As directed    Call MD for:  temperature >100.4   Complete by:  As directed    Diet general  Complete by:  As directed        Medication List    TAKE these medications   docusate sodium 100 MG capsule Commonly known as:  COLACE Take 1 capsule (100 mg total) by mouth daily. Start taking on:  03/30/2017   oxyCODONE-acetaminophen 5-325 MG tablet Commonly known as:  PERCOCET/ROXICET Take 1-2 tablets by mouth every 6 (six)  hours as needed for moderate pain or severe pain.   polyethylene glycol packet Commonly known as:  MIRALAX / GLYCOLAX Take 17 g by mouth daily. Start taking on:  03/30/2017      Follow-up Information    Abigail Miyamoto, MD. Schedule an appointment as soon as possible for a visit in 1 week(s).   Specialty:  General Surgery Why:  after discharge for follow up  Contact information: [address] [place] [address] [phone]        Jolaine Click, MD Follow up in 10 day(s).   Specialties:  Interventional Radiology, Radiology Why:  pt to seen in clinic for CT and eval with Dr Bonnielee Haff; pt will hear from scheduler for time and date; call [phone] if any questions or concerns Contact information: [address] [place] [address] [phone]        Kendra Opitz, NP. Schedule an appointment as soon as possible for a visit in 4 day(s).   Why:  To be seen with repeat labs (CBC & CMP). Contact information: [address] [place] [address] [phone]        Sherrilyn Rist, MD. Schedule an appointment as soon as possible for a visit.   Specialty:  Gastroenterology Contact information: [address] [place] [address] [place] [address] [phone]          Allergies  Allergen Reactions  . Wellbutrin [Bupropion] Other (See Comments)    SEIZURES      Procedures/Studies: Ct Abdomen W Contrast  Result Date: 03/26/2017 CLINICAL DATA:  Evaluate for bile leak. EXAM: CT ABDOMEN WITH CONTRAST TECHNIQUE: Multidetector CT imaging of the abdomen was performed using the standard protocol following bolus administration of intravenous contrast. CONTRAST:  75mL ISOVUE-300 IOPAMIDOL (ISOVUE-300) INJECTION 61% COMPARISON:  MRI 03/23/2017 FINDINGS: Lower chest: There is atelectasis identified within both lung bases. Hepatobiliary: Pneumobilia identified. There has been interval placement of a common bile duct stent with decompression of  previous dilated intrahepatic bile ducts. Pancreas: Normal appearance of the pancreas. Spleen: The spleen is unremarkable. Adrenals/Urinary Tract: The adrenal glands are normal. Normal appearance of the kidneys. Stomach/Bowel: The stomach is normal. The small bowel loops have a normal course and caliber. Scratch set mild increase caliber of the small bowel loops measuring up to 3 cm. Normal appearance of the visualized large bowel loops. Vascular/Lymphatic: Normal appearance of the abdominal aorta. No enlarged upper abdominal lymph nodes. Other: Subhepatic fluid collection is again identified compatible with bile leak. This demonstrates mild increase in thickness when compared with previous MRI. Distance between the dome of liver and diaphragm measures 4.8 cm on the current exam, image 55 of series 6. 3.1 cm previously. Musculoskeletal: The visualized osseous structures are unremarkable. IMPRESSION: 1. Slight increase in volume of perihepatic ascites when compared with MRI from 03/23/2017. 2. Pneumobilia and decreased intrahepatic biliary dilatation status post common bile duct stenting. These results will be called to the ordering clinician or representative by the Radiologist Assistant, and communication documented in the PACS or zVision Dashboard. Electronically Signed   By:  Signa Kellaylor  Stroud M.D.   On: 03/26/2017 17:31   Nm Hepatobiliary Liver Func  Result Date: 03/24/2017 CLINICAL DATA:  36 year old female postoperative day 9 coli cystectomy. Abdomen MRI yesterday with increasing perihepatic free fluid suspicious for bile leak. EXAM: NUCLEAR MEDICINE HEPATOBILIARY IMAGING TECHNIQUE: Sequential images of the abdomen were obtained out to 60 minutes following intravenous administration of radiopharmaceutical. RADIOPHARMACEUTICALS:  5  mCi Tc-4724m  Choletec IV COMPARISON:  Abdomen MRI 03/23/2017 and earlier. FINDINGS: Initially there is prompt radiotracer uptake by the liver and clearance of the blood pool. CBD  activity is visible by 20 minutes. Small bowel activity visible by 20 minutes and increases over the course of the study. At the same time there is subtle abnormal radiotracer accumulation occurring along the inferior right liver tip (Image 13. Progressive radiotracer activity then along the right lateral and superior perihepatic space, such that after 1 hour the more concentrated perihepatic radiotracer activity creates a negative silhouette of the hepatic contour (image 60). This exam was reviewed with Dr. Amil AmenEdmunds. IMPRESSION: 1. Positive for bile leak with perihepatic accumulation of the excreted hepatic radiotracer corresponding to the perihepatic fluid collection on MRI yesterday. This pattern suggests a subcapsular bile leak. 2. The CBD is patent. Electronically Signed   By: Odessa FlemingH  Hall M.D.   On: 03/24/2017 15:14   Mr Abdomen Mrcp Wo Contrast  Result Date: 03/24/2017 CLINICAL DATA:  Biliary colic status post laparoscopic cholecystectomy 03/15/2017. Concern for cholangitis and choledocholithiasis. EXAM: MRI ABDOMEN WITHOUT CONTRAST  (INCLUDING MRCP) TECHNIQUE: Multiplanar multisequence MR imaging of the abdomen was performed. Heavily T2-weighted images of the biliary and pancreatic ducts were obtained, and three-dimensional MRCP images were rendered by post processing. COMPARISON:  Abdominopelvic CT 03/20/2017. FINDINGS: Lower chest: Progressively lower lung volumes with increasing atelectasis at both lung bases. No significant pleural or pericardial effusion. Hepatobiliary: Mild hepatic steatosis with loss of signal on gradient echo opposed phase images. No focal lesions identified on noncontrast imaging. Examination is mildly motion degraded, especially on the thin section MRCP images. The gallbladder is surgically absent. There is no significant fluid collection of the cholecystectomy bed. There is mild biliary dilatation, improved from CT. The common hepatic duct measures up to 9 mm in diameter and the  common bile duct 8 mm. No definite retained calculi are seen on the coronal images. On series 8, tiny distal intraductal calculi (images 38 and 39) at the ampulla are difficult to exclude. The ampullary prominence on prior CT is not evident. Pancreas: Unremarkable. No pancreatic ductal dilatation or surrounding inflammatory changes. Spleen: Normal in size without focal abnormality. Adrenals/Urinary Tract: Both adrenal glands appear normal. The kidneys appear normal without evidence of urinary tract calculus or hydronephrosis. Bladder not imaged. Stomach/Bowel: No evidence of bowel wall thickening, distention or surrounding inflammatory change. Vascular/Lymphatic: There are no enlarged abdominal lymph nodes. No significant vascular findings. Other: There is increasing ascites which is predominately perihepatic, measuring up to 3.1 cm in thickness. Some fluid extends into the right pericolic gutter. No focal extraluminal collection. Musculoskeletal: No acute or significant osseous findings. IMPRESSION: 1. Compared with the CT of 3 days prior, biliary dilatation has slightly improved. No definite choledocholithiasis. Tiny calculi in the distal common bile duct difficult to completely exclude, but the patient's elevated liver function studies are slowly improving. 2. Increasing perihepatic ascites suspicious for possible biliary leak. Consider nuclear medicine hepatobiliary scan for further evaluation. 3. Mild hepatic steatosis. 4. Progressive atelectasis at both lung bases. Electronically Signed   By: Chrissie NoaWilliam  Purcell Mouton M.D.   On: 03/24/2017 09:13   Dg Ercp Biliary & Pancreatic Ducts  Result Date: 03/26/2017 CLINICAL DATA:  Bile duct stone. Possible postop bile leak. Recent cholecystectomy. EXAM: ERCP TECHNIQUE: Multiple spot images obtained with the fluoroscopic device and submitted for interpretation post-procedure. FLUOROSCOPY TIME:  Fluoroscopy Time:  6 minutes and 10 seconds Number of Acquired Spot Images: 7  COMPARISON:  MRI 11/22/2009 2018 FINDINGS: Cannulation and opacification of the biliary system. There is a small round filling defect in the common bile duct and this is suggestive for stone. Mild dilatation of the extrahepatic biliary system. Contrast is clearly seen outside of the biliary system. Contrast is layering along the inferior aspect of the liver. The source of the bile leak may be from a inferior right intrahepatic branch. Balloon sweep was performed and non metallic biliary stent was placed in the common bile duct. IMPRESSION: Positive for a bile leak. Bile leak may be originating from a right intrahepatic branch. Choledocholithiasis.  Stone removal and placement of biliary stent. These images were submitted for radiologic interpretation only. Please see the procedural report for the amount of contrast and the fluoroscopy time utilized. Electronically Signed   By: Richarda Overlie M.D.   On: 03/26/2017 11:20   US Abdomen Limited Ruq  Result Date: 03/26/2017 CLINICAL DATA:  Post laparoscopic cholecystectomy complicated by bile leak. ERCP with stent placed today. Evaluate biloma. EXAM: ULTRASOUND ABDOMEN LIMITED RIGHT UPPER QUADRANT COMPARISON:  CT abdomen 1 hour prior. FINDINGS: Gallbladder: Gallbladder is surgically absent. No focal fluid collection in the gallbladder fossa. Common bile duct: Diameter: 4 mm proximally, shadowing biliary stent is partially visualized. Liver: Scattered intrahepatic linear shadowing consistent pneumobilia primarily in the left lobe, as seen on CT. No focal hepatic lesion. Hepatic echogenicity is coarsened. Portal vein is patent on color Doppler imaging with normal direction of blood flow towards the liver. Moderate perihepatic fluid, similar to CT 1 hour prior. IMPRESSION: 1. Postcholecystectomy. Biliary stent and intrahepatic pneumobilia, as seen on CT. 2. Perihepatic fluid, similar to CT allowing for differences in modality. Electronically Signed   By: Rubye Oaks  M.D.   On: 03/26/2017 18:30      Subjective: Feels much better. Minimal output from RUQ JP drain. Tolerating diet without nausea or vomiting. Still has mild RUQ abdominal pain.  Discharge Exam:  Vitals:   03/28/17 0600 03/28/17 1410 03/28/17 2046 03/29/17 0551  BP: 109/73 120/73 111/72 104/66  Pulse: 80 91 74 72  Resp: 17 18 18 16   Temp: 98 F (36.7 C) 98.4 F (36.9 C) 98.4 F (36.9 C) 98.6 F (37 C)  TempSrc: Oral Oral Oral Oral  SpO2: 97% 97% 95% 95%  Weight:    96.2 kg (212 lb 1.3 oz)  Height:        General exam: Pleasant young female sitting up comfortably in chair. Appears in no distress. Respiratory system: Clear to auscultation. Respiratory effort normal.  Cardiovascular system: S1 & S2 heard, RRR. No JVD, murmurs, rubs, gallops or clicks. No pedal edema.  Gastrointestinal system: Abdomen is nondistended, soft and nontender. RUQ JP drain with minimal clear liquid. Epigastric laparoscopic incision site healing without acute findings. Other sites look okay. Central nervous system: Alert and oriented. No focal neurological deficits.  Extremities: Symmetric 5 x 5 power. Skin: No rashes, lesions or ulcers Psychiatry: Judgement and insight appear normal. Mood & affect appropriate.      The results of significant diagnostics from this hospitalization (including imaging, microbiology, ancillary and laboratory)  are listed below for reference.     Microbiology: Recent Results (from the past 240 hour(s))  Aerobic/Anaerobic Culture (surgical/deep wound)     Status: None (Preliminary result)   Collection Time: 03/27/17  7:15 PM  Result Value Ref Range Status   Specimen Description ABDOMEN ABSCESS  Final   Special Requests NONE  Final   Gram Stain   Final    RARE WBC PRESENT, PREDOMINANTLY PMN NO ORGANISMS SEEN    Culture   Final    NO GROWTH 2 DAYS NO ANAEROBES ISOLATED; CULTURE IN PROGRESS FOR 5 DAYS   Report Status PENDING  Incomplete     Labs: CBC: Recent  Labs  Lab 03/23/17 1134 03/24/17 0349 03/25/17 0635 03/27/17 0649 03/27/17 1009 03/28/17 0505  WBC 11.3* 9.4 8.1 12.4* 12.7* 11.5*  NEUTROABS 8.7*  --  5.0  --   --   --   HGB 13.1 13.1 12.3 12.5 13.3 12.9  HCT 40.2 38.4 37.2 37.1 39.1 38.6  MCV 92.6 90.6 91.0 90.3 90.7 90.8  PLT 274 301 359 400 448* 445*   Basic Metabolic Panel: Recent Labs  Lab 03/25/17 0635 03/26/17 0526 03/27/17 0649 03/27/17 1009 03/28/17 0505  NA 138 138 141 139 138  K 3.3* 3.7 3.2* 2.8* 3.8  CL 106 105 106 106 104  CO2 24 24 24 23 24   GLUCOSE 115* 115* 93 99 89  BUN <5* <5* 5* <5* <5*  CREATININE 0.64 0.64 0.64 0.66 0.65  CALCIUM 8.4* 8.7* 8.6* 8.7* 8.8*  MG  --  1.6* 2.0  --   --    Liver Function Tests: Recent Labs  Lab 03/25/17 0635 03/26/17 0526 03/27/17 0649 03/27/17 1009 03/28/17 0505  AST 17 14* 22 30 33  ALT 91* 74* 58* 64* 54  ALKPHOS 223* 219* 225* 236* 216*  BILITOT 1.5* 1.5* 1.4* 1.9* 1.4*  PROT 5.4* 5.7* 5.5* 6.0* 6.1*  ALBUMIN 2.6* 2.7* 2.6* 2.9* 2.8*   CBG: Recent Labs  Lab 03/23/17 1345 03/28/17 1144  GLUCAP 78 94   Urinalysis    Component Value Date/Time   COLORURINE YELLOW 03/24/2017 0306   APPEARANCEUR CLEAR 03/24/2017 0306   LABSPEC 1.009 03/24/2017 0306   PHURINE 6.0 03/24/2017 0306   GLUCOSEU NEGATIVE 03/24/2017 0306   HGBUR SMALL (A) 03/24/2017 0306   BILIRUBINUR NEGATIVE 03/24/2017 0306   KETONESUR 80 (A) 03/24/2017 0306   PROTEINUR NEGATIVE 03/24/2017 0306   NITRITE NEGATIVE 03/24/2017 0306   LEUKOCYTESUR NEGATIVE 03/24/2017 0306      Time coordinating discharge: Over 30 minutes  SIGNED:  Marcellus ScottAnand Carla Rashad, MD, FACP, [facility]. [facility] Pager [phone]  If 7PM-7AM, please contact night-coverage [email or web address] Password [email or web address] 03/29/2017, 1:48 PM

## 2017-03-29 NOTE — Progress Notes (Signed)
1600 Patient discharged to home. Verbalizes understanding of all discharge instructions including drain care, discharge medications, and follow up MD visits. Patient is accompanied by mother.

## 2017-03-29 NOTE — Progress Notes (Signed)
[facility] Progress Note  3 Days Post-Op   Assessment/Plan  s/p Procedure(s): ENDOSCOPIC RETROGRADE CHOLANGIOPANCREATOGRAPHY (ERCP)  Bile leak status post laparoscopic cholecystectomy 12 /4, [place]. -ERCP 12/14 shows leak from right hepatic duct which is almost certainly duct of L[place]ch[place] in gallbladder bed. This should stop now that the biliary tree is decompressed  Perihepatic fluid collection.large biloma collection. - IR drain placement 12/15, of biloma performed - cultures show NGTD - can stop antibiotics  Plan: -home with drain -drain management per interventional radiology drain clinic.  This will need to be arranged -[facility]    LOS: 6 days    Subjective: CC; RUQ abdominal pain  Pain is improved. Pt had a small BM on the 13th and 14th. Tolerating diet. No nausea, vomiting, fever, chills, cough. Discussed Miralax and colace with pt. Added today.   Objective: Vital signs in last 24 hours: Temp:  [98.4 F (36.9 C)-98.6 F (37 C)] 98.6 F (37 C) (12/17 0551) Pulse Rate:  [72-91] 72 (12/17 0551) Resp:  [16-18] 16 (12/17 0551) BP: (104-120)/(66-73) 104/66 (12/17 0551) SpO2:  [95 %-97 %] 95 % (12/17 0551) Weight:  [212 lb 1.3 oz (96.2 kg)] 212 lb 1.3 oz (96.2 kg) (12/17 0551) Last BM Date: 03/26/17  Intake/Output from previous day: 12/16 0701 - 12/17 0700 In: 2175.3 [P.O.:852; I.V.:1213.3; IV Piggyback:100] Out: 1140 [Urine:1100; Drains:40] Intake/Output this shift: No intake/output data recorded.  PE: Gen: Alert, NAD, pleasant, cooperative Pulm:Rate andeffort normal Abd: Soft,not distended,+BS, mild RUQ TTP without guarding, port site without purulent drainage, wound is clean. Subcutaneous sutures intact, drain with scant bilious drainage Skin: no rashes noted, warm and dry   Anti-infectives: Anti-infectives (From admission, onward)   Start     Dose/Rate  Route Frequency Ordered Stop   03/23/17 1130  metroNIDAZOLE (FLAGYL) IVPB 500 mg  Status:  Discontinued     500 mg 100 mL/hr over 60 Minutes Intravenous Every 8 hours 03/23/17 1109 03/26/17 1105   03/23/17 1130  cefTRIAXone (ROCEPHIN) 2 g in dextrose 5 % 50 mL IVPB     2 g 100 mL/hr over 30 Minutes Intravenous Every 24 hours 03/23/17 1112        Lab Results:  Recent Labs    03/27/17 1009 03/28/17 0505  WBC 12.7* 11.5*  HGB 13.3 12.9  HCT 39.1 38.6  PLT 448* 445*   BMET Recent Labs    03/27/17 1009 03/28/17 0505  NA 139 138  K 2.8* 3.8  CL 106 104  CO2 23 24  GLUCOSE 99 89  BUN <5* <5*  CREATININE 0.66 0.65  CALCIUM 8.7* 8.8*   PT/INR No results for input(s): LABPROT, INR in the last 72 hours. CMP     Component Value Date/Time   NA 138 03/28/2017 0505   K 3.8 03/28/2017 0505   CL 104 03/28/2017 0505   CO2 24 03/28/2017 0505   GLUCOSE 89 03/28/2017 0505   BUN <5 (L) 03/28/2017 0505   CREATININE 0.65 03/28/2017 0505   CALCIUM 8.8 (L) 03/28/2017 0505   PROT 6.1 (L) 03/28/2017 0505   ALBUMIN 2.8 (L) 03/28/2017 0505   AST 33 03/28/2017 0505   ALT 54 03/28/2017 0505   ALKPHOS 216 (H) 03/28/2017 0505   BILITOT 1.4 (H) 03/28/2017 0505   GFRNONAA >60 03/28/2017 0505   GFRAA >60 03/28/2017 0505   Lipase  No results found for: LIPASE  Studies/Results: No results found.    Shanda BumpsJessica  Charlene BrookeL Damascus Feldpausch , [facility] 03/29/2017, 8:44 AM Pager: [phone] Consults: [phone] Mon-Fri 7:00 am-4:30 pm Sat-Sun 7:00 am-11:30 am

## 2017-03-29 NOTE — Telephone Encounter (Signed)
Jan 16th

## 2017-03-29 NOTE — Telephone Encounter (Signed)
-----   Message from Leverne HumblesJulia M Fournier, RN sent at 03/29/2017 10:29 AM EST ----- Regarding: RE: follow up ERCP / stent pull Good Morning, There is an ERCP slot available on 1/16-Wednesday at 1:30. Let me know if you would like her scheduled then. Thanks, Raynelle FanningJulie ----- Message ----- From: Sherrilyn Ristanis, Sayvion Vigen L III, MD Sent: 03/29/2017   7:26 AM To: Benancio DeedsSteven P Armbruster, MD, Leverne HumblesJulia M Fournier, RN Subject: RE: follow up ERCP / stent pull                Brett CanalesSteve,    I will take care of this since I did her ERCP. Perhaps you can help coordinate wit IR when the drain should be pulled. Should know if the stent is working when drainage stops. Assuming it does soon, I will plan for ERCP in a few weeks to remove drain and repeat ERCP to confirm leak has stopped.  Raynelle FanningJulie,    This will have to be on one of my afternoons off.  Would you please see about the availability of time in [facility] endo lab Wed, Jan 16th at 12:30 or 1.  Then check with me before booking it. Thanks  - HD ----- Message ----- From: Benancio DeedsArmbruster, Steven P, MD Sent: 03/27/2017   5:24 PM To: Sherrilyn RistHenry L Danis III, MD, Leverne HumblesJulia M Fournier, RN Subject: follow up ERCP / stent pull                    Raynelle FanningJulie this patient will need a follow up ERCP with stent pull in the next several weeks.  Sherilyn CooterHenry is this something you would like to do, or have Jesusita OkaDan do it at one of his Thursday blocks?  Raynelle FanningJulie I can see her back in clinic in a month or so for reassessment and coordination of this. Thanks

## 2017-03-30 ENCOUNTER — Telehealth: Payer: Self-pay

## 2017-03-30 ENCOUNTER — Other Ambulatory Visit: Payer: Self-pay | Admitting: Surgery

## 2017-03-30 DIAGNOSIS — K7689 Other specified diseases of liver: Secondary | ICD-10-CM

## 2017-03-30 NOTE — Telephone Encounter (Signed)
-----   Message from Charlie PitterHenry L Danis III, MD sent at 03/29/2017 12:56 PM EST ----- Regarding: RE: follow up ERCP / stent pull Yes, please schedule it for that date and time and coordinate with patient.  - HD ----- Message ----- From: Leverne HumblesFournier, Airyonna Franklyn M, RN Sent: 03/29/2017  10:29 AM To: Charlie PitterHenry L Danis III, MD Subject: RE: follow up ERCP / stent pull                Good Morning, There is an ERCP slot available on 1/16-Wednesday at 1:30. Let me know if you would like her scheduled then. Thanks, Raynelle FanningJulie ----- Message ----- From: Sherrilyn Ristanis, Henry L III, MD Sent: 03/29/2017   7:26 AM To: Benancio DeedsSteven P Armbruster, MD, Leverne HumblesJulia M Fournier, RN Subject: RE: follow up ERCP / stent pull                Brett CanalesSteve,    I will take care of this since I did her ERCP. Perhaps you can help coordinate wit IR when the drain should be pulled. Should know if the stent is working when drainage stops. Assuming it does soon, I will plan for ERCP in a few weeks to remove drain and repeat ERCP to confirm leak has stopped.  Raynelle FanningJulie,    This will have to be on one of my afternoons off.  Would you please see about the availability of time in [facility] endo lab Wed, Jan 16th at 12:30 or 1.  Then check with me before booking it. Thanks  - HD ----- Message ----- From: Benancio DeedsArmbruster, Steven P, MD Sent: 03/27/2017   5:24 PM To: Sherrilyn RistHenry L Danis III, MD, Leverne HumblesJulia M Fournier, RN Subject: follow up ERCP / stent pull                    Raynelle FanningJulie this patient will need a follow up ERCP with stent pull in the next several weeks.  Sherilyn CooterHenry is this something you would like to do, or have Jesusita OkaDan do it at one of his Thursday blocks?  Raynelle FanningJulie I can see her back in clinic in a month or so for reassessment and coordination of this. Thanks

## 2017-03-30 NOTE — Telephone Encounter (Signed)
Spoke to patient today to let her know about upcoming ERCP. I let her know to expect the prep instructions in the mail. She also has follow up visit scheduled with Dr. Myrtie Neitheranis on 05/07/17.

## 2017-03-31 DIAGNOSIS — K838 Other specified diseases of biliary tract: Secondary | ICD-10-CM | POA: Diagnosis not present

## 2017-03-31 DIAGNOSIS — Z9189 Other specified personal risk factors, not elsewhere classified: Secondary | ICD-10-CM | POA: Diagnosis not present

## 2017-03-31 DIAGNOSIS — K668 Other specified disorders of peritoneum: Secondary | ICD-10-CM | POA: Diagnosis not present

## 2017-03-31 DIAGNOSIS — K9189 Other postprocedural complications and disorders of digestive system: Secondary | ICD-10-CM | POA: Diagnosis not present

## 2017-04-01 LAB — AEROBIC/ANAEROBIC CULTURE W GRAM STAIN (SURGICAL/DEEP WOUND): Culture: NO GROWTH

## 2017-04-05 DIAGNOSIS — K668 Other specified disorders of peritoneum: Secondary | ICD-10-CM | POA: Diagnosis not present

## 2017-04-05 DIAGNOSIS — K9189 Other postprocedural complications and disorders of digestive system: Secondary | ICD-10-CM | POA: Diagnosis not present

## 2017-04-05 DIAGNOSIS — K838 Other specified diseases of biliary tract: Secondary | ICD-10-CM | POA: Diagnosis not present

## 2017-04-08 ENCOUNTER — Ambulatory Visit (HOSPITAL_COMMUNITY)
Admission: RE | Admit: 2017-04-08 | Discharge: 2017-04-08 | Disposition: A | Discharge status: Home / Self Care | Payer: BLUE CROSS/BLUE SHIELD | Source: Ambulatory Visit | Attending: Surgery | Admitting: Surgery

## 2017-04-08 ENCOUNTER — Ambulatory Visit (HOSPITAL_COMMUNITY)
Admission: RE | Admit: 2017-04-08 | Discharge: 2017-04-08 | Disposition: A | Discharge status: Home / Self Care | Payer: BLUE CROSS/BLUE SHIELD | Source: Ambulatory Visit | Attending: Radiology | Admitting: Radiology

## 2017-04-08 DIAGNOSIS — K449 Diaphragmatic hernia without obstruction or gangrene: Secondary | ICD-10-CM | POA: Insufficient documentation

## 2017-04-08 DIAGNOSIS — Z9049 Acquired absence of other specified parts of digestive tract: Secondary | ICD-10-CM | POA: Insufficient documentation

## 2017-04-08 DIAGNOSIS — K7689 Other specified diseases of liver: Secondary | ICD-10-CM | POA: Diagnosis not present

## 2017-04-08 DIAGNOSIS — K839 Disease of biliary tract, unspecified: Secondary | ICD-10-CM | POA: Diagnosis not present

## 2017-04-08 DIAGNOSIS — Z09 Encounter for follow-up examination after completed treatment for conditions other than malignant neoplasm: Secondary | ICD-10-CM | POA: Diagnosis not present

## 2017-04-08 DIAGNOSIS — K9189 Other postprocedural complications and disorders of digestive system: Secondary | ICD-10-CM | POA: Diagnosis not present

## 2017-04-08 DIAGNOSIS — K831 Obstruction of bile duct: Secondary | ICD-10-CM | POA: Diagnosis not present

## 2017-04-08 MED ORDER — IOPAMIDOL (ISOVUE-300) INJECTION 61%
INTRAVENOUS | Status: AC
  Administered 2017-04-08: 100 mL
  Filled 2017-04-08: qty 100

## 2017-04-08 NOTE — Progress Notes (Signed)
Referring Physician(s): Blackman,Douglas  Supervising Physician: Ruel FavorsShick, Trevor  Patient Status: [facility]  Chief Complaint: Biloma  Subjective: Patient with development of bile leak after cholecystectomy.  She underwent drain placement in IR on 03/30/17 and was discharged home with drain in place. [facility] for follow-up of her drain. She reports she has been feeling better.  She has been flushing drain once daily.  Output has been minimal (2-5 mL daily), cloudy-yellow in color. She denies fever, chills, abdominal pain, nausea, or vomiting.   Allergies: Wellbutrin [bupropion]  Medications: Prior to Admission medications   Medication Sig Start Date End Date Taking? Authorizing Provider  docusate sodium (COLACE) 100 MG capsule Take 1 capsule (100 mg total) by mouth daily. 03/30/17   Hongalgi, Maximino GreenlandAnand D, MD  oxyCODONE-acetaminophen (PERCOCET/ROXICET) 5-325 MG tablet Take 1-2 tablets by mouth every 6 (six) hours as needed for moderate pain or severe pain. 03/29/17   Hongalgi, Maximino GreenlandAnand D, MD  polyethylene glycol (MIRALAX / GLYCOLAX) packet Take 17 g by mouth daily. 03/30/17   Elease EtienneHongalgi, Anand D, MD     Vital Signs: There were no vitals taken for this visit.  Physical Exam  NAD, alert Abdomen:  Drain in place.  Insertion site intact without erythema or warmth.  Trace amount of milky, yellow output.   Imaging: Ct Abdomen Pelvis W Contrast  Result Date: 04/08/2017 CLINICAL DATA:  BILE LEAK STATUS POST CHOLECYSTECTOMY. STATUS POST ENDOSCOPIC STENT AND PERIHEPATIC BILOMA DRAIN. EXAM: CT ABDOMEN AND PELVIS WITH CONTRAST TECHNIQUE: Multidetector CT imaging of the abdomen and pelvis was performed using the standard protocol following bolus administration of intravenous contrast. CONTRAST:  100mL ISOVUE-300 IOPAMIDOL (ISOVUE-300) INJECTION 61% COMPARISON:  03/26/2017, 03/24/2017 FINDINGS: Lower chest: Minor bibasilar atelectasis. Normal heart size. No pericardial  or pleural effusion. Small hiatal hernia. Hepatobiliary: Previous cholecystectomy. Pneumobilia again evident. Stable endoscopic stent within the biliary tree terminating in the duodenum. No other focal hepatic abnormality. Right perihepatic drain in stable position. Improvement in the perihepatic/ subcapsular fluid collection extending subdiaphragmatic. Trace fluid remains. No new abdominal fluid collections. Pancreas: Unremarkable. No pancreatic ductal dilatation or surrounding inflammatory changes. Spleen: Normal in size without focal abnormality. Adrenals/Urinary Tract: Adrenal glands are unremarkable. Kidneys are normal, without renal calculi, focal lesion, or hydronephrosis. Bladder is unremarkable. Stomach/Bowel: Stomach is within normal limits. Appendix appears normal. No evidence of bowel wall thickening, distention, or inflammatory changes. Vascular/Lymphatic: No significant vascular findings are present. No enlarged abdominal or pelvic lymph nodes. Reproductive: Uterus and bilateral adnexa are unremarkable. Other: No abdominal wall hernia or abnormality. No abdominopelvic ascites. Musculoskeletal: No acute or significant osseous findings. IMPRESSION: Improvement in the perihepatic/ subcapsular biloma status post percutaneous drain. Trace fluid remains about the liver. Stable pneumobilia and endoscopic biliary stent following cholecystectomy related bile leak. No new abdominal or pelvic fluid collections. Electronically Signed   By: Judie PetitM.  Shick M.D.   On: 04/08/2017 09:17    Labs:  CBC: Recent Labs    03/25/17 0635 03/27/17 0649 03/27/17 1009 03/28/17 0505  WBC 8.1 12.4* 12.7* 11.5*  HGB 12.3 12.5 13.3 12.9  HCT 37.2 37.1 39.1 38.6  PLT 359 400 448* 445*    COAGS: Recent Labs    03/23/17 1134 03/24/17 0349  INR 1.13 1.08  APTT 30  --     BMP: Recent Labs    03/26/17 0526 03/27/17 0649 03/27/17 1009 03/28/17 0505  NA 138 141 139 138  K 3.7 3.2* 2.8* 3.8  CL 105 106 106  104    CO2 24 24 23 24   GLUCOSE 115* 93 99 89  BUN <5* 5* <5* <5*  CALCIUM 8.7* 8.6* 8.7* 8.8*  CREATININE 0.64 0.64 0.66 0.65  GFRNONAA >60 >60 >60 >60  GFRAA >60 >60 >60 >60    LIVER FUNCTION TESTS: Recent Labs    03/26/17 0526 03/27/17 0649 03/27/17 1009 03/28/17 0505  BILITOT 1.5* 1.4* 1.9* 1.4*  AST 14* 22 30 33  ALT 74* 58* 64* 54  ALKPHOS 219* 225* 236* 216*  PROT 5.7* 5.5* 6.0* 6.1*  ALBUMIN 2.7* 2.6* 2.9* 2.8*    Assessment and Plan: Biloma s/p drain placement 03/30/17 Patient has been continuing to improve at home.  Her biloma drain remains in place.  CT Abdomen/Pelvis reviewed with Dr. Miles CostainShick; results dictated separately.  There is improvement in fluid collection, but a trace amount of fluid remains.  Patient has plans for follow-up with surgical team next week.  Continue current management.  Continue to flush catheter once daily with sterile saline and continue to record output.  Patient will be seen in IR clinic in 2 weeks without imaging.  If continues to improve and no significant change in drainage, catheter can likely be removed.   Electronically Signed: Hoyt KochKacie Sue-Ellen Isaly Fasching, PA 04/08/2017, 1:12 PM   I spent a total of 15 Minutes at the the patient's bedside AND on the patient's hospital floor or unit, greater than 50% of which was counseling/coordinating care for biloma.

## 2017-04-12 DIAGNOSIS — K9189 Other postprocedural complications and disorders of digestive system: Secondary | ICD-10-CM | POA: Diagnosis not present

## 2017-04-12 DIAGNOSIS — K838 Other specified diseases of biliary tract: Secondary | ICD-10-CM | POA: Diagnosis not present

## 2017-04-14 DIAGNOSIS — K839 Disease of biliary tract, unspecified: Secondary | ICD-10-CM | POA: Diagnosis not present

## 2017-04-16 ENCOUNTER — Telehealth: Payer: Self-pay

## 2017-04-16 ENCOUNTER — Other Ambulatory Visit: Payer: Self-pay

## 2017-04-16 DIAGNOSIS — K8031 Calculus of bile duct with cholangitis, unspecified, with obstruction: Secondary | ICD-10-CM

## 2017-04-16 NOTE — Telephone Encounter (Signed)
Contacted patient to let her know of time change for [facility], now she needs to arrive at 10:00 am for 11:30 procedure.

## 2017-04-22 ENCOUNTER — Other Ambulatory Visit: Payer: BLUE CROSS/BLUE SHIELD

## 2017-04-23 ENCOUNTER — Other Ambulatory Visit: Payer: Self-pay

## 2017-04-23 ENCOUNTER — Encounter (HOSPITAL_COMMUNITY): Payer: Self-pay

## 2017-04-28 ENCOUNTER — Ambulatory Visit (HOSPITAL_COMMUNITY)
Admission: RE | Admit: 2017-04-28 | Discharge: 2017-04-28 | Disposition: A | Discharge status: Home / Self Care | Payer: BLUE CROSS/BLUE SHIELD | Source: Ambulatory Visit | Attending: Gastroenterology | Admitting: Gastroenterology

## 2017-04-28 ENCOUNTER — Ambulatory Visit (HOSPITAL_COMMUNITY): Payer: BLUE CROSS/BLUE SHIELD | Admitting: Anesthesiology

## 2017-04-28 ENCOUNTER — Ambulatory Visit (HOSPITAL_COMMUNITY): Payer: BLUE CROSS/BLUE SHIELD

## 2017-04-28 ENCOUNTER — Other Ambulatory Visit: Payer: Self-pay

## 2017-04-28 ENCOUNTER — Encounter (HOSPITAL_COMMUNITY): Payer: Self-pay

## 2017-04-28 ENCOUNTER — Encounter (HOSPITAL_COMMUNITY)
Admission: RE | Disposition: A | Discharge status: Home / Self Care | Payer: Self-pay | Source: Ambulatory Visit | Attending: Gastroenterology

## 2017-04-28 DIAGNOSIS — Z9889 Other specified postprocedural states: Secondary | ICD-10-CM | POA: Diagnosis not present

## 2017-04-28 DIAGNOSIS — F419 Anxiety disorder, unspecified: Secondary | ICD-10-CM | POA: Diagnosis not present

## 2017-04-28 DIAGNOSIS — Z888 Allergy status to other drugs, medicaments and biological substances status: Secondary | ICD-10-CM | POA: Diagnosis not present

## 2017-04-28 DIAGNOSIS — Z9049 Acquired absence of other specified parts of digestive tract: Secondary | ICD-10-CM | POA: Diagnosis not present

## 2017-04-28 DIAGNOSIS — K838 Other specified diseases of biliary tract: Secondary | ICD-10-CM | POA: Diagnosis not present

## 2017-04-28 DIAGNOSIS — Z79899 Other long term (current) drug therapy: Secondary | ICD-10-CM | POA: Diagnosis not present

## 2017-04-28 DIAGNOSIS — K839 Disease of biliary tract, unspecified: Secondary | ICD-10-CM

## 2017-04-28 DIAGNOSIS — K9189 Other postprocedural complications and disorders of digestive system: Secondary | ICD-10-CM | POA: Diagnosis not present

## 2017-04-28 DIAGNOSIS — K8031 Calculus of bile duct with cholangitis, unspecified, with obstruction: Secondary | ICD-10-CM

## 2017-04-28 DIAGNOSIS — E282 Polycystic ovarian syndrome: Secondary | ICD-10-CM | POA: Insufficient documentation

## 2017-04-28 DIAGNOSIS — Z87891 Personal history of nicotine dependence: Secondary | ICD-10-CM | POA: Insufficient documentation

## 2017-04-28 DIAGNOSIS — N390 Urinary tract infection, site not specified: Secondary | ICD-10-CM | POA: Diagnosis not present

## 2017-04-28 DIAGNOSIS — K8050 Calculus of bile duct without cholangitis or cholecystitis without obstruction: Secondary | ICD-10-CM | POA: Diagnosis not present

## 2017-04-28 HISTORY — PX: ERCP: SHX5425

## 2017-04-28 HISTORY — DX: Anxiety disorder, unspecified: F41.9

## 2017-04-28 HISTORY — DX: Cardiac arrhythmia, unspecified: I49.9

## 2017-04-28 LAB — PREGNANCY, URINE: Preg Test, Ur: NEGATIVE

## 2017-04-28 SURGERY — ERCP, WITH INTERVENTION IF INDICATED
Anesthesia: General

## 2017-04-28 MED ORDER — OXYCODONE HCL 5 MG/5ML PO SOLN: 5.0000 mg | Freq: Once | ORAL | Status: DC | PRN

## 2017-04-28 MED ORDER — FENTANYL CITRATE (PF) 100 MCG/2ML IJ SOLN
INTRAMUSCULAR | Status: DC | PRN
  Administered 2017-04-28: 100 ug via INTRAVENOUS

## 2017-04-28 MED ORDER — DEXAMETHASONE SODIUM PHOSPHATE 10 MG/ML IJ SOLN
INTRAMUSCULAR | Status: DC | PRN
  Administered 2017-04-28: 5 mg via INTRAVENOUS

## 2017-04-28 MED ORDER — FENTANYL CITRATE (PF) 100 MCG/2ML IJ SOLN: 25.0000 ug | INTRAMUSCULAR | Status: DC | PRN

## 2017-04-28 MED ORDER — ACETAMINOPHEN 160 MG/5ML PO SOLN
325.0000 mg | ORAL | Status: DC | PRN
  Filled 2017-04-28: qty 20.3

## 2017-04-28 MED ORDER — PROPOFOL 10 MG/ML IV BOLUS
INTRAVENOUS | Status: DC | PRN
  Administered 2017-04-28: 200 mg via INTRAVENOUS

## 2017-04-28 MED ORDER — ONDANSETRON HCL 4 MG/2ML IJ SOLN
INTRAMUSCULAR | Status: DC | PRN
  Administered 2017-04-28: 4 mg via INTRAVENOUS

## 2017-04-28 MED ORDER — ROCURONIUM BROMIDE 10 MG/ML (PF) SYRINGE
PREFILLED_SYRINGE | INTRAVENOUS | Status: DC | PRN
  Administered 2017-04-28: 40 mg via INTRAVENOUS

## 2017-04-28 MED ORDER — PROPOFOL 10 MG/ML IV BOLUS
INTRAVENOUS | Status: AC
  Filled 2017-04-28: qty 20

## 2017-04-28 MED ORDER — IOPAMIDOL (ISOVUE-370) INJECTION 76%
INTRAVENOUS | Status: DC | PRN
  Administered 2017-04-28: 50 mL via INTRAVENOUS

## 2017-04-28 MED ORDER — SUGAMMADEX SODIUM 200 MG/2ML IV SOLN
INTRAVENOUS | Status: DC | PRN
  Administered 2017-04-28: 200 mg via INTRAVENOUS

## 2017-04-28 MED ORDER — CIPROFLOXACIN IN D5W 400 MG/200ML IV SOLN
INTRAVENOUS | Status: DC | PRN
  Administered 2017-04-28: 400 mg via INTRAVENOUS

## 2017-04-28 MED ORDER — MEPERIDINE HCL 25 MG/ML IJ SOLN: 6.2500 mg | INTRAMUSCULAR | Status: DC | PRN

## 2017-04-28 MED ORDER — SODIUM CHLORIDE 0.9 % IV SOLN: INTRAVENOUS | Status: DC

## 2017-04-28 MED ORDER — CIPROFLOXACIN IN D5W 400 MG/200ML IV SOLN
INTRAVENOUS | Status: AC
  Filled 2017-04-28: qty 200

## 2017-04-28 MED ORDER — OXYCODONE HCL 5 MG PO TABS: 5.0000 mg | ORAL_TABLET | Freq: Once | ORAL | Status: DC | PRN

## 2017-04-28 MED ORDER — ONDANSETRON HCL 4 MG/2ML IJ SOLN: 4.0000 mg | Freq: Once | INTRAMUSCULAR | Status: DC | PRN

## 2017-04-28 MED ORDER — PHENYLEPHRINE 40 MCG/ML (10ML) SYRINGE FOR IV PUSH (FOR BLOOD PRESSURE SUPPORT)
PREFILLED_SYRINGE | INTRAVENOUS | Status: DC | PRN
  Administered 2017-04-28: 120 ug via INTRAVENOUS
  Administered 2017-04-28: 80 ug via INTRAVENOUS

## 2017-04-28 MED ORDER — ACETAMINOPHEN 325 MG PO TABS
325.0000 mg | ORAL_TABLET | ORAL | Status: DC | PRN
  Filled 2017-04-28: qty 2

## 2017-04-28 MED ORDER — FENTANYL CITRATE (PF) 100 MCG/2ML IJ SOLN
INTRAMUSCULAR | Status: AC
  Filled 2017-04-28: qty 2

## 2017-04-28 MED ORDER — LIDOCAINE 2% (20 MG/ML) 5 ML SYRINGE
INTRAMUSCULAR | Status: DC | PRN
  Administered 2017-04-28: 60 mg via INTRAVENOUS

## 2017-04-28 MED ORDER — INDOMETHACIN 50 MG RE SUPP
RECTAL | Status: AC
  Filled 2017-04-28: qty 2

## 2017-04-28 MED ORDER — GLUCAGON HCL RDNA (DIAGNOSTIC) 1 MG IJ SOLR
INTRAMUSCULAR | Status: AC
  Filled 2017-04-28: qty 1

## 2017-04-28 MED ORDER — INDOMETHACIN 50 MG RE SUPP
RECTAL | Status: DC | PRN
  Administered 2017-04-28: 100 mg via RECTAL

## 2017-04-28 MED ORDER — KETOROLAC TROMETHAMINE 30 MG/ML IJ SOLN
30.0000 mg | Freq: Once | INTRAMUSCULAR | Status: DC | PRN
  Filled 2017-04-28: qty 1

## 2017-04-28 MED ORDER — LACTATED RINGERS IV SOLN
INTRAVENOUS | Status: DC
  Administered 2017-04-28: 13:00:00 via INTRAVENOUS
  Administered 2017-04-28: 1000 mL via INTRAVENOUS

## 2017-04-28 NOTE — Anesthesia Postprocedure Evaluation (Signed)
Anesthesia Post Note  Patient: Brenda DikeJennifer Westerlund  Procedure(s) Performed: ENDOSCOPIC RETROGRADE CHOLANGIOPANCREATOGRAPHY (ERCP) (N/A )     Patient location during evaluation: PACU Anesthesia Type: General Level of consciousness: awake and alert Pain management: pain level controlled Vital Signs Assessment: post-procedure vital signs reviewed and stable Respiratory status: spontaneous breathing, nonlabored ventilation, respiratory function stable and patient connected to nasal cannula oxygen Cardiovascular status: blood pressure returned to baseline and stable Postop Assessment: no apparent nausea or vomiting Anesthetic complications: no    Last Vitals:  Vitals:   04/28/17 1350 04/28/17 1355  BP: 126/78   Pulse: 63 66  Resp: 11 11  Temp:    SpO2: 98% 98%    Last Pain:  Vitals:   04/28/17 1318  TempSrc: Oral  PainSc:                  Eusevio Schriver

## 2017-04-28 NOTE — Anesthesia Preprocedure Evaluation (Signed)
Anesthesia Evaluation  Patient identified by MRN, date of birth, ID band Patient awake    Reviewed: Allergy & Precautions, NPO status , Patient's Chart, lab work & pertinent test results  Airway Mallampati: I       Dental no notable dental hx. (+) Teeth Intact   Pulmonary neg pulmonary ROS, former smoker,    Pulmonary exam normal breath sounds clear to auscultation       Cardiovascular negative cardio ROS Normal cardiovascular exam Rhythm:Regular Rate:Normal     Neuro/Psych negative neurological ROS  negative psych ROS   GI/Hepatic negative GI ROS, Neg liver ROS,   Endo/Other  negative endocrine ROS  Renal/GU negative Renal ROS  negative genitourinary   Musculoskeletal negative musculoskeletal ROS (+)   Abdominal (+) + obese,   Peds negative pediatric ROS (+)  Hematology negative hematology ROS (+)   Anesthesia Other Findings   Reproductive/Obstetrics negative OB ROS                             Anesthesia Physical  Anesthesia Plan  ASA: II  Anesthesia Plan: General   Post-op Pain Management:    Induction: Intravenous  PONV Risk Score and Plan: 4 or greater and Ondansetron, Dexamethasone, Scopolamine patch - Pre-op and Treatment may vary due to age or medical condition  Airway Management Planned: Oral ETT  Additional Equipment:   Intra-op Plan:   Post-operative Plan: Extubation in OR  Informed Consent: I have reviewed the patients History and Physical, chart, labs and discussed the procedure including the risks, benefits and alternatives for the proposed anesthesia with the patient or authorized representative who has indicated his/her understanding and acceptance.   Dental advisory given  Plan Discussed with: CRNA, Surgeon and Anesthesiologist  Anesthesia Plan Comments:         Anesthesia Quick Evaluation

## 2017-04-28 NOTE — H&P (Signed)
History:  This patient presents for endoscopic testing for recent bile leak.  Brenda LeveringJennifer Ernsberger Referring physician: Kendra Opitzole, Dawn Watkins, NP  Past Medical History: Past Medical History:  Diagnosis Date  . Anxiety   . Biliary colic   . Dysrhythmia    palpitations  . PCOS (polycystic ovarian syndrome)   . Seizures (HCC) 2003   "brought on by RX" (03/23/2017)     Past Surgical History: Past Surgical History:  Procedure Laterality Date  . ERCP N/A 03/26/2017   Procedure: ENDOSCOPIC RETROGRADE CHOLANGIOPANCREATOGRAPHY (ERCP);  Surgeon: Sherrilyn Ristanis, Henry L III, MD;  [facility];  Service: Gastroenterology;  Laterality: N/A;  . LAPAROSCOPIC CHOLECYSTECTOMY  03/15/2017  . WISDOM TOOTH EXTRACTION      Allergies: Allergies  Allergen Reactions  . Wellbutrin [Bupropion] Other (See Comments)    SEIZURES    Outpatient Meds: Current Facility-Administered Medications  Medication Dose Route Frequency Provider Last Rate Last Dose  . 0.9 %  sodium chloride infusion   Intravenous Continuous Charlie Pitteranis, Henry L III, MD          ___________________________________________________________________ Objective   Exam:  LMP 03/23/2017    CV: RRR without murmur, S1/S2, no JVD, no peripheral edema  Resp: clear to auscultation bilaterally, normal RR and effort noted  GI: soft, no tenderness, with active bowel sounds. No guarding or palpable organomegaly noted.  Neuro: awake, alert and oriented x 3. Normal gross motor function and fluent speech   Assessment:  Bile leak  Plan:  Biliary stent removal and ERCP   Charlie PitterHenry L Danis III

## 2017-04-28 NOTE — Interval H&P Note (Signed)
History and Physical Interval Note:  04/28/2017 11:01 AM  Brenda LeveringJennifer Luna  has presented today for surgery, with the diagnosis of Bile duct stone, cholangitis  The various methods of treatment have been discussed with the patient and family. After consideration of risks, benefits and other options for treatment, the patient has consented to  Procedure(s) with comments: ENDOSCOPIC RETROGRADE CHOLANGIOPANCREATOGRAPHY (ERCP) (N/A) - STENT REMOVAL as a surgical intervention .  The patient's history has been reviewed, patient examined, no change in status, stable for surgery.  I have reviewed the patient's chart and labs.  Questions were answered to the patient's satisfaction.     Charlie PitterHenry L Danis III

## 2017-04-28 NOTE — Discharge Instructions (Signed)
YOU HAD AN ENDOSCOPIC PROCEDURE TODAY: Refer to the procedure report and other information in the discharge instructions given to you for any specific questions about what was found during the examination. If this information does not answer your questions, please call  office at [phone] to clarify.   YOU SHOULD EXPECT: Some feelings of bloating in the abdomen. Passage of more gas than usual. Walking can help get rid of the air that was put into your GI tract during the procedure and reduce the bloating. If you had a lower endoscopy (such as a colonoscopy or flexible sigmoidoscopy) you may notice spotting of blood in your stool or on the toilet paper. Some abdominal soreness may be present for a day or two, also.  DIET: Your first meal following the procedure should be a light meal and then it is ok to progress to your normal diet. A half-sandwich or bowl of soup is an example of a good first meal. Heavy or fried foods are harder to digest and may make you feel nauseous or bloated. Drink plenty of fluids but you should avoid alcoholic beverages for 24 hours. If you had a esophageal dilation, please see attached instructions for diet.    ACTIVITY: Your care partner should take you home directly after the procedure. You should plan to take it easy, moving slowly for the rest of the day. You can resume normal activity the day after the procedure however YOU SHOULD NOT DRIVE, use power tools, machinery or perform tasks that involve climbing or major physical exertion for 24 hours (because of the sedation medicines used during the test).   SYMPTOMS TO REPORT IMMEDIATELY: A gastroenterologist can be reached at any hour. Please call [phone]  for any of the following symptoms:  Following lower endoscopy (colonoscopy, flexible sigmoidoscopy) Excessive amounts of blood in the stool  Significant tenderness, worsening of abdominal pains  Swelling of the abdomen that is new, acute  Fever of 100 or  higher  Following upper endoscopy (EGD, EUS, ERCP, esophageal dilation) Vomiting of blood or coffee ground material  New, significant abdominal pain  New, significant chest pain or pain under the shoulder blades  Painful or persistently difficult swallowing  New shortness of breath  Black, tarry-looking or red, bloody stools  FOLLOW UP:  If any biopsies were taken you will be contacted by phone or by letter within the next 1-3 weeks. Call [phone]  if you have not heard about the biopsies in 3 weeks.  Please also call with any specific questions about appointments or follow up tests.            Return to Work  [employer] treated at our facility.  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 Your employer should be aware of your condition and can help with the necessary work activity restrictions. If you wish to return to work sooner than the date that is listed above, or if you have further problems that make it difficult for you to return at that time, please call our clinic or your health care provider. _________________________________________ Health Care Provider Name (printed) _________________________________________ Health Care Provider (signature) _________________________________________ Date   This information is not intended to replace advice given to you by your health care provider. Make sure you discuss any questions you have with your health  care provider. Document Released: 03/30/2005 Document Revised: 03/13/2016 Document Reviewed: 10/27/2013 Elsevier Interactive Patient Education  [employer]2018 Elsevier Inc.

## 2017-04-28 NOTE — Op Note (Signed)
[facility] Patient Name: Brenda Luna Procedure Date: 04/28/2017 MRN: [id number] Attending MD: Starr Lake. Myrtie Neither , MD Date of Birth: 05-06-1980 CSN: [id number] Age: 37 Admit Type: Outpatient Procedure:                ERCP Indications:              Follow-up of bile duct stone(s), Follow-up of bile                            leak Providers:                Sherilyn Cooter L. Myrtie Neither, MD, Leandrew Koyanagi, RN, Tomma Rakers, RN, Arlee Muslim Tech., Technician,                            Mirian Mo, CRNA Referring MD:              Medicines:                General Anesthesia, Indomethacin 100 mg PR, Cipro                            400 mg IV Complications:            No immediate complications. Estimated Blood Loss:     Estimated blood loss: none. Procedure:                Pre-Anesthesia Assessment:                           - Prior to the procedure, a History and Physical                            was performed, and patient medications and                            allergies were reviewed. The patient's tolerance of                            previous anesthesia was also reviewed. The risks                            and benefits of the procedure and the sedation                            options and risks were discussed with the patient.                            All questions were answered, and informed consent                            was obtained. Prior Anticoagulants: The patient has                            taken no  previous anticoagulant or antiplatelet                            agents. ASA Grade Assessment: II - A patient with                            mild systemic disease. After reviewing the risks                            and benefits, the patient was deemed in                            satisfactory condition to undergo the procedure.                           After obtaining informed consent, the scope was            passed under direct vision. Throughout the                            procedure, the patient's blood pressure, pulse, and                            oxygen saturations were monitored continuously. The                            [id number] scope was introduced through the                            mouth, and used to inject contrast into and used to                            inject contrast into the bile duct. The ERCP was                            performed with difficulty due to significant                            looping in a J-shaped stomach. The patient                            tolerated the procedure well. Scope In: Scope Out: Findings:      A scout film of the abdomen was obtained. Surgical clips, consistent       with a previous cholecystectomy, were seen in the area of the right       upper quadrant of the abdomen. The esophagus was successfully intubated       under direct vision. The scope was advanced to a normal major papilla in       the descending duodenum without detailed examination of the pharynx,       larynx and associated structures, and upper GI tract. The upper GI tract       was grossly normal. One plastic stent originating in the biliary tree       was emerging from the major papilla. The  biliary stent were removed with       a snare and removal of the entire scope. The scope was then re-inserted       through the mouth and advanced to the major papilla. A biliary       sphincterotomy had been performed. The sphincterotomy appeared open.       0.035 inch x 260 cm straight Hydra Jagwire was passed into the biliary       tree. The traction (standard) sphincterotome was passed over the       guidewire and the bile duct was then deeply cannulated. Contrast was       injected. I personally interpreted the bile duct images. There was brisk       flow of contrast through the ducts. Image quality was excellent.       Contrast extended to the hepatic  ducts. The intra-hepatic and       extra-hepatic biliary duct system was normal. Specifically, no stones or       bile leak were seen. To discover objects, the biliary tree was swept       with a 9 mm balloon starting at the bifurcation. Nothing was found. The       total fluoroscopy exposure time was 1 minute and 49 seconds. Impression:               - Prior biliary endoscopic sphincterotomy appeared                            open.                           - The cholangiogram was normal.                           - The biliary tree was swept and nothing was found. Moderate Sedation:      GETA Recommendation:           - Continue present medications.                           - Return to my office as needed. Procedure Code(s):        --- Professional ---                           [phone], Endoscopic retrograde                            cholangiopancreatography (ERCP); diagnostic,                            including collection of specimen(s) by brushing or                            washing, when performed (separate procedure)                           43247, Esophagogastroduodenoscopy, flexible,                            transoral; with removal of foreign body(s) Diagnosis Code(s):        ---  Professional ---                           K80.50, Calculus of bile duct without cholangitis                            or cholecystitis without obstruction                           K83.8, Other specified diseases of biliary tract CPT copyright 2016 American Medical Association. All rights reserved. The codes documented in this report are preliminary and upon coder review may  be revised to meet current compliance requirements. Henry L. Myrtie Neitheranis, MD 04/28/2017 1:14:55 PM This report has been signed electronically. Number of Addenda: 0

## 2017-04-28 NOTE — Anesthesia Procedure Notes (Signed)
Procedure Name: Intubation Performed by: Gean Maidens, CRNA Pre-anesthesia Checklist: Patient identified, Emergency Drugs available, Suction available, Patient being monitored and Timeout performed Patient Re-evaluated:Patient Re-evaluated prior to induction Oxygen Delivery Method: Circle system utilized Preoxygenation: Pre-oxygenation with 100% oxygen Induction Type: IV induction Ventilation: Mask ventilation without difficulty Laryngoscope Size: Mac and 4 Grade View: Grade I Tube type: Oral Tube size: 7.0 mm Number of attempts: 1 Airway Equipment and Method: Stylet Placement Confirmation: ETT inserted through vocal cords under direct vision,  positive ETCO2,  CO2 detector and breath sounds checked- equal and bilateral Secured at: 21 cm Tube secured with: Tape Dental Injury: Teeth and Oropharynx as per pre-operative assessment

## 2017-04-28 NOTE — Transfer of Care (Signed)
Immediate Anesthesia Transfer of Care Note  Patient: Brenda LeveringJennifer Mermelstein  Procedure(s) Performed: ENDOSCOPIC RETROGRADE CHOLANGIOPANCREATOGRAPHY (ERCP) (N/A )  Patient Location: PACU  Anesthesia Type:General  Level of Consciousness: awake, alert  and oriented  Airway & Oxygen Therapy: Patient Spontanous Breathing and Patient connected to face mask oxygen  Post-op Assessment: Report given to RN and Post -op Vital signs reviewed and stable  Post vital signs: Reviewed and stable  Last Vitals:  Vitals:   04/28/17 1121  BP: 118/76  Pulse: 68  Resp: 18  Temp: 36.8 C  SpO2: 99%    Last Pain:  Vitals:   04/28/17 1121  TempSrc: Oral  PainSc: 1          Complications: No apparent anesthesia complications

## 2017-04-29 ENCOUNTER — Encounter (HOSPITAL_COMMUNITY): Payer: Self-pay | Admitting: Gastroenterology

## 2017-05-04 DIAGNOSIS — K9189 Other postprocedural complications and disorders of digestive system: Secondary | ICD-10-CM | POA: Diagnosis not present

## 2017-05-04 DIAGNOSIS — K838 Other specified diseases of biliary tract: Secondary | ICD-10-CM | POA: Diagnosis not present

## 2017-05-04 DIAGNOSIS — B029 Zoster without complications: Secondary | ICD-10-CM | POA: Diagnosis not present

## 2017-05-04 DIAGNOSIS — S233XXA Sprain of ligaments of thoracic spine, initial encounter: Secondary | ICD-10-CM | POA: Diagnosis not present

## 2017-05-07 ENCOUNTER — Ambulatory Visit: Payer: BLUE CROSS/BLUE SHIELD | Admitting: Gastroenterology

## 2017-06-16 DIAGNOSIS — Z1331 Encounter for screening for depression: Secondary | ICD-10-CM | POA: Diagnosis not present

## 2017-06-16 DIAGNOSIS — F329 Major depressive disorder, single episode, unspecified: Secondary | ICD-10-CM | POA: Diagnosis not present

## 2017-06-16 DIAGNOSIS — F331 Major depressive disorder, recurrent, moderate: Secondary | ICD-10-CM | POA: Diagnosis not present

## 2017-06-16 DIAGNOSIS — F419 Anxiety disorder, unspecified: Secondary | ICD-10-CM | POA: Diagnosis not present

## 2018-08-16 IMAGING — CT CT ABDOMEN W/ CM
2 of 4 series · 16 of 46 positions shown, 18 images · IV contrast (iopamidol)
Comparison: MRI 03/23/2017

CLINICAL DATA: Evaluate for bile leak.

EXAM:
CT ABDOMEN WITH CONTRAST
TECHNIQUE: Multidetector CT imaging of the abdomen was performed using the
standard protocol following bolus administration of intravenous
contrast.
CONTRAST:  75mL [id number] IOPAMIDOL ([id number]) INJECTION 61%

[Series 3: abdomen 5.0 i30f 2 · axial · 0.90mm/px · z∈[-11,+299]mm · 13 of 70 slices shown, 15 images]
[im 4/70  soft-tissue]
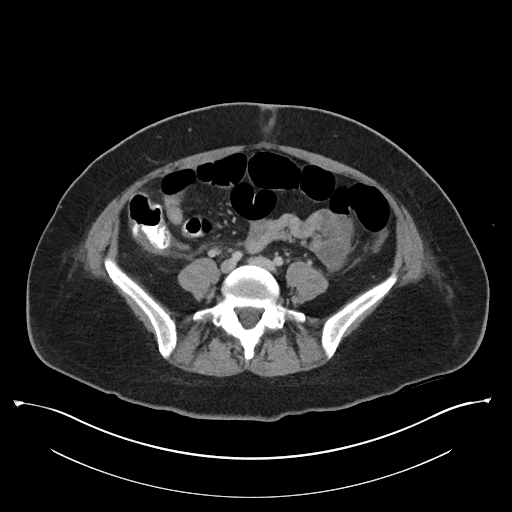
[im 4/70  bone]
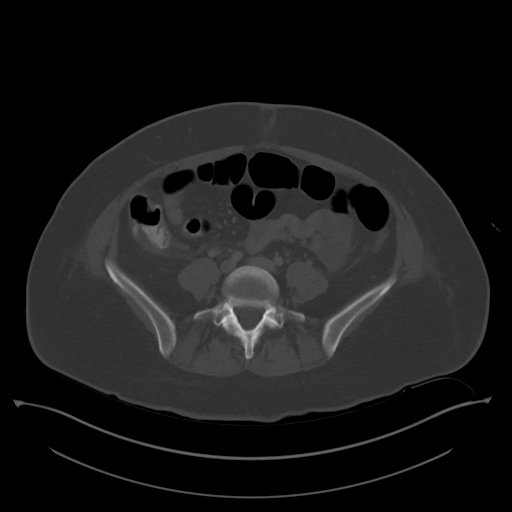
[im 10/70  soft-tissue]
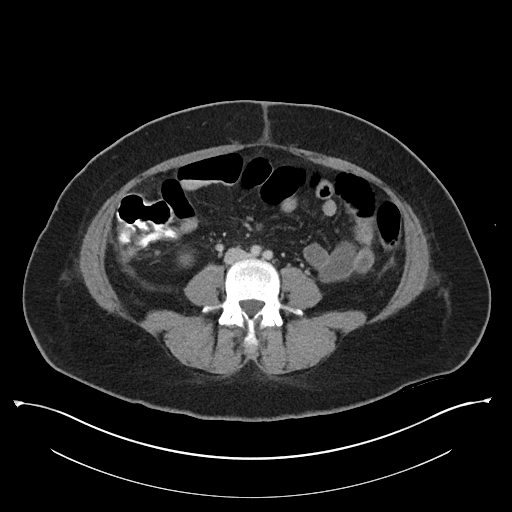
[im 16/70  soft-tissue]
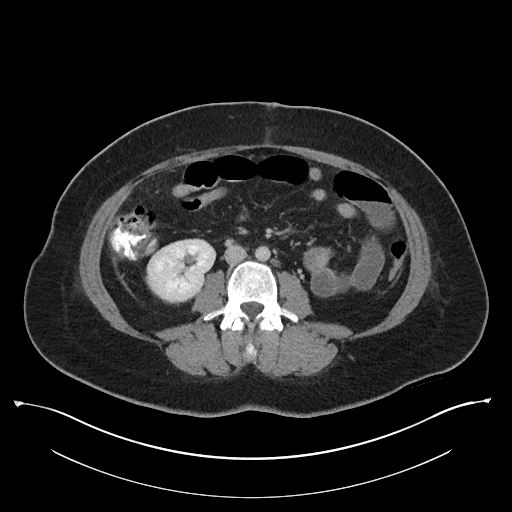
[im 19/70  soft-tissue]
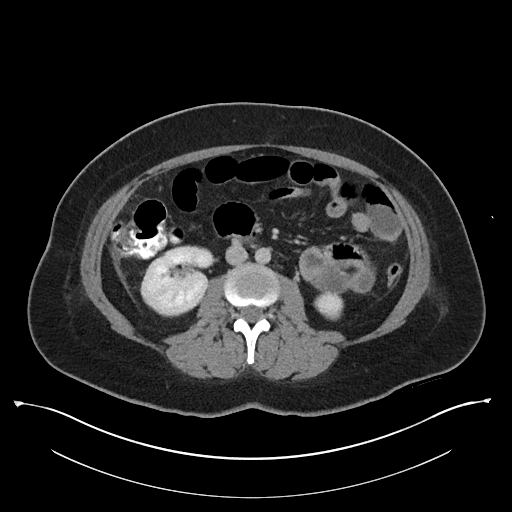
[im 26/70  soft-tissue]
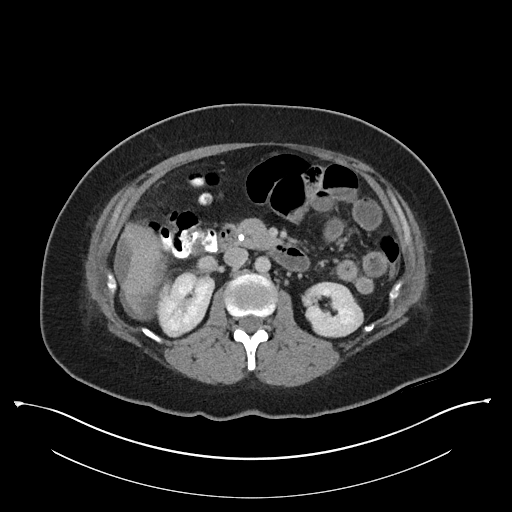
[im 29/70  soft-tissue]
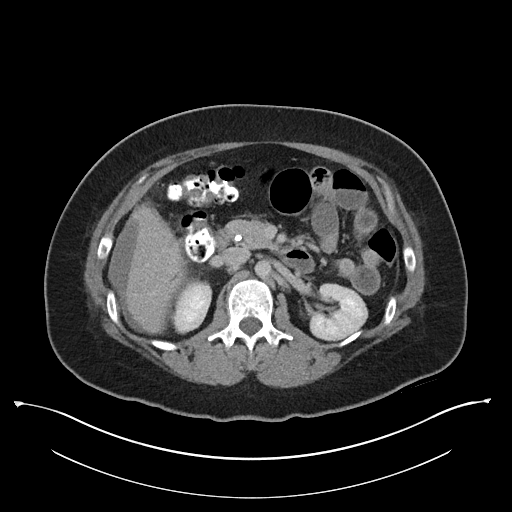
[im 35/70  soft-tissue]
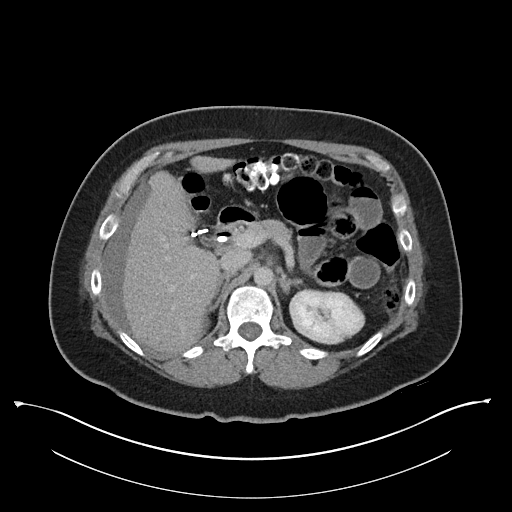
[im 41/70  soft-tissue]
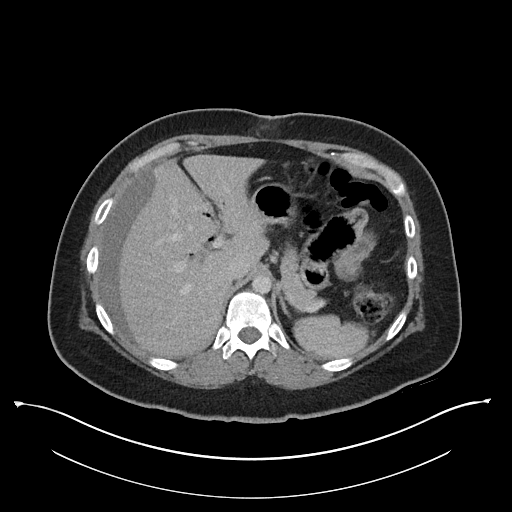
[im 44/70  soft-tissue]
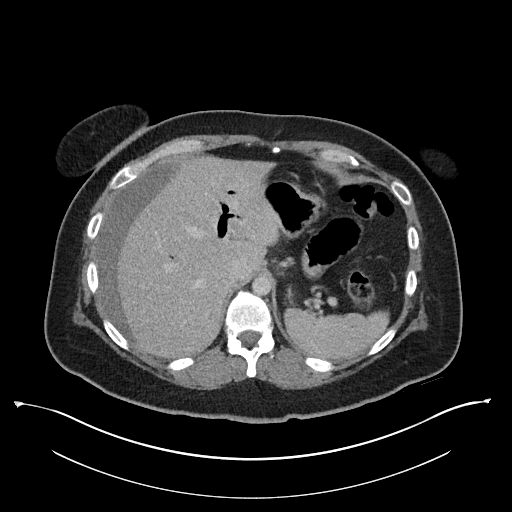
[im 44/70  bone]
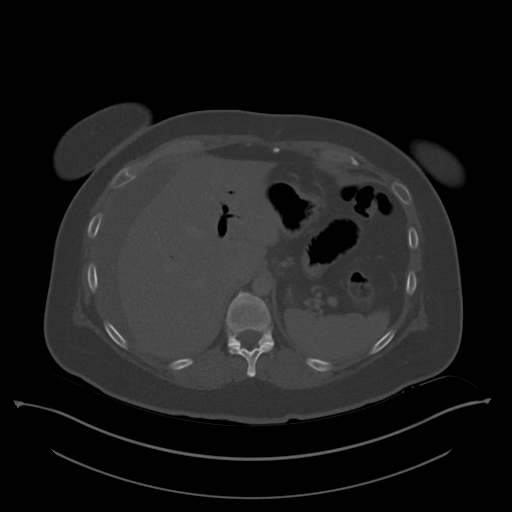
[im 51/70  soft-tissue]
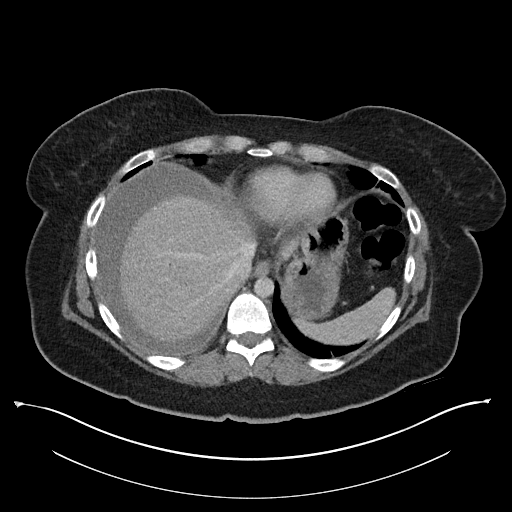
[im 54/70  soft-tissue]
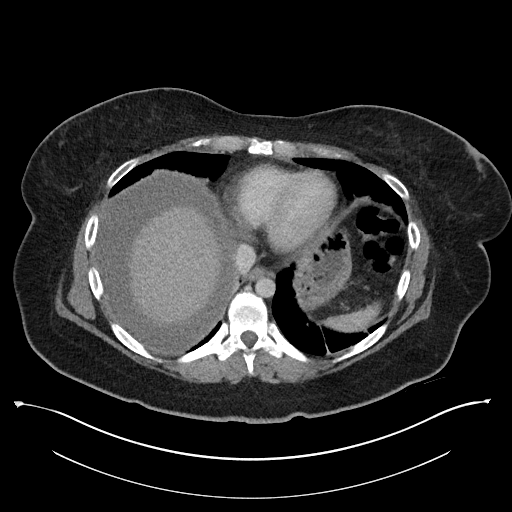
[im 60/70  soft-tissue]
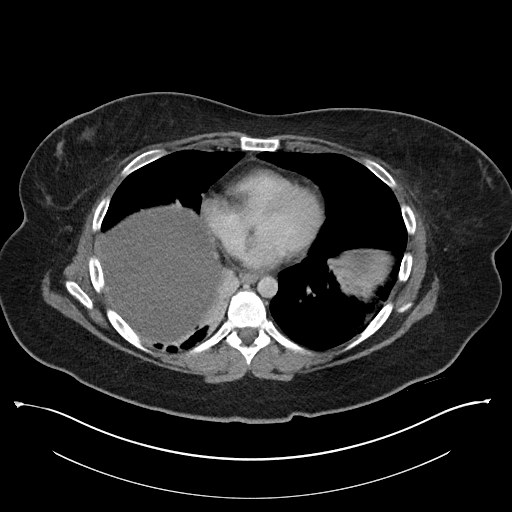
[im 66/70  soft-tissue]
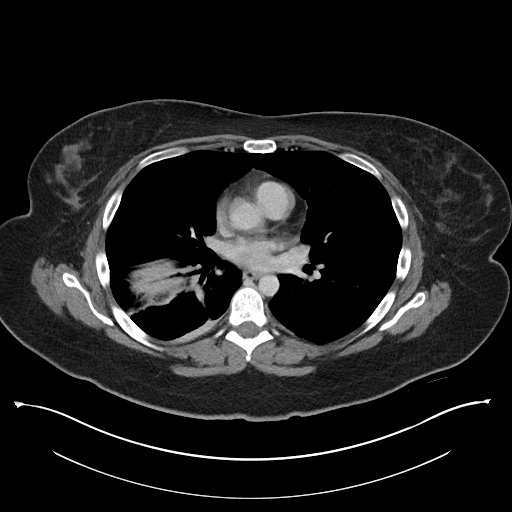

[Series 6: coronal soft tissue · coronal · 0.68mm/px · 3 of 96 slices shown]
[im 32/96  soft-tissue]
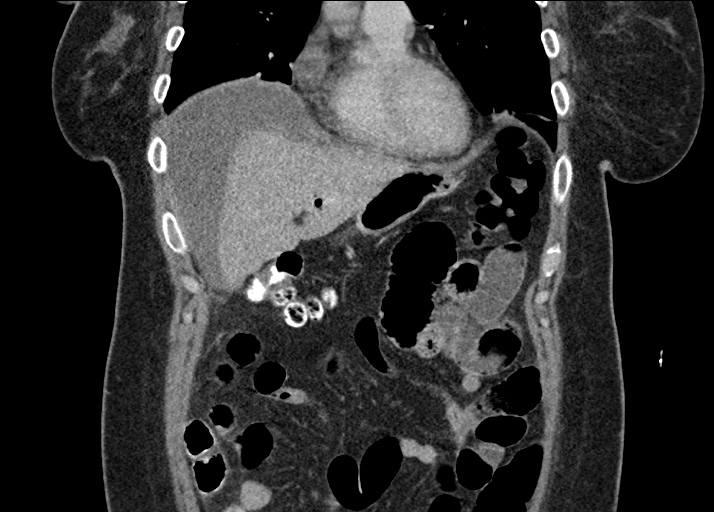
[im 43/96  soft-tissue]
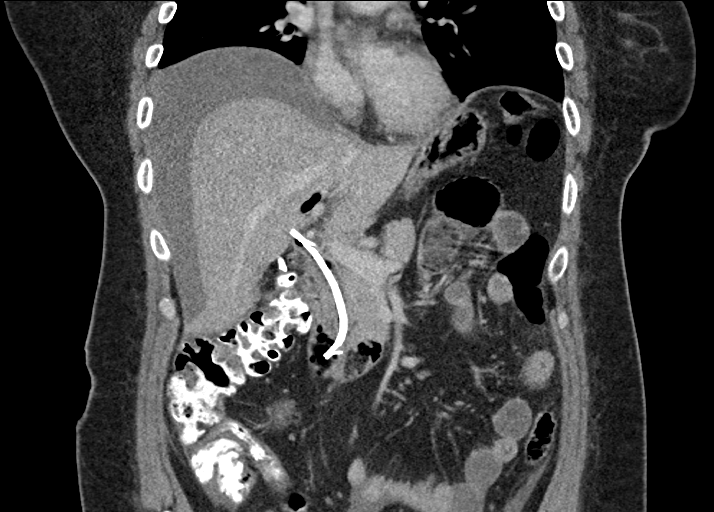
[im 53/96  soft-tissue]
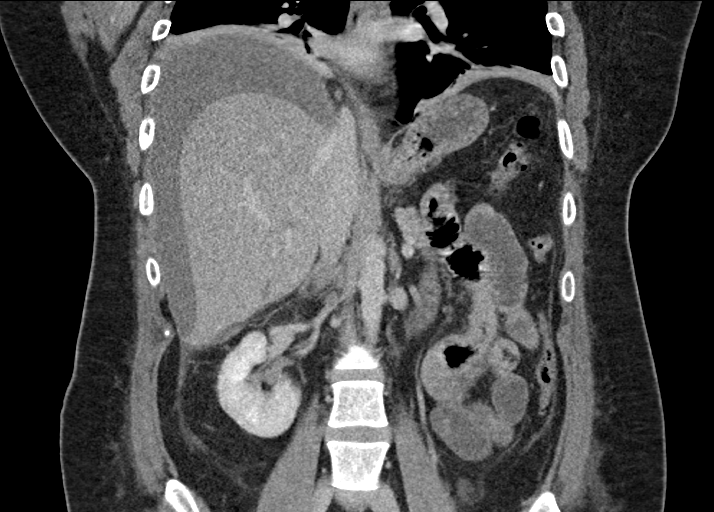

[16 of 46 positions shown; findings below may reference images not displayed]

FINDINGS: Lower chest: There is atelectasis identified within both lung bases.

Hepatobiliary: Pneumobilia identified. There has been interval
placement of a common bile duct stent with decompression of previous
dilated intrahepatic bile ducts.

Pancreas: Normal appearance of the pancreas.

Spleen: The spleen is unremarkable.

Adrenals/Urinary Tract: The adrenal glands are normal. Normal
appearance of the kidneys.

Stomach/Bowel: The stomach is normal. The small bowel loops have a
normal course and caliber. Scratch set mild increase caliber of the
small bowel loops measuring up to 3 cm. Normal appearance of the
visualized large bowel loops.

Vascular/Lymphatic: Normal appearance of the abdominal aorta. No
enlarged upper abdominal lymph nodes.

Other: Subhepatic fluid collection is again identified compatible
with bile leak. This demonstrates mild increase in thickness when
compared with previous MRI. Distance between the dome of liver and
diaphragm measures 4.8 cm on the current exam, image 55 of series [DATE] cm previously.

Musculoskeletal: The visualized osseous structures are unremarkable.
IMPRESSION: 1. Slight increase in volume of perihepatic ascites when compared
with MRI from 03/23/2017.
[DATE]. Pneumobilia and decreased intrahepatic biliary dilatation status
post common bile duct stenting.
These results will be called to the ordering clinician or
representative by the Radiologist Assistant, and communication
documented in the PACS or zVision Dashboard.

## 2018-08-17 IMAGING — CT CT IMAGE GUIDED DRAINAGE BY PERCUTANEOUS CATHETER
1 of 2 series · 14 of 32 positions shown, 19 images · non-contrast
Comparison: none

INDICATION: Bile leak after cholecystostomy.  Biloma.
TECHNIQUE: Informed written consent was obtained from the patient after a
thorough discussion of the procedural risks, benefits and
alternatives. All questions were addressed. Maximal Sterile Barrier
Technique was utilized including caps, mask, sterile gowns, sterile
gloves, sterile drape, hand hygiene and skin antiseptic. A timeout
was performed prior to the initiation of the procedure.

[Series 2: i-spiral 5.0 b40f · axial · 0.81mm/px · z∈[+1131,+1338]mm · 14 of 67 slices shown, 19 images]
[im 4/67  soft-tissue]
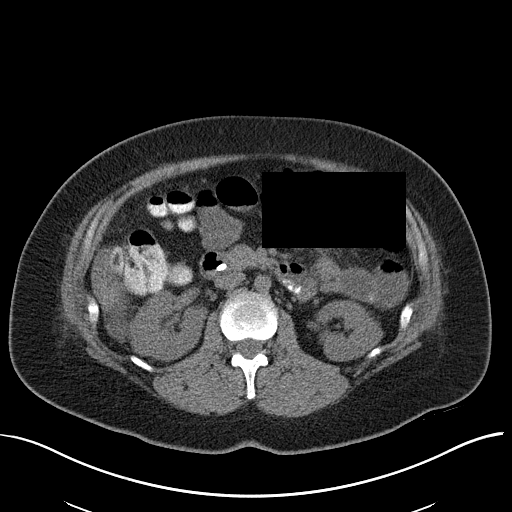
[im 4/67  bone]
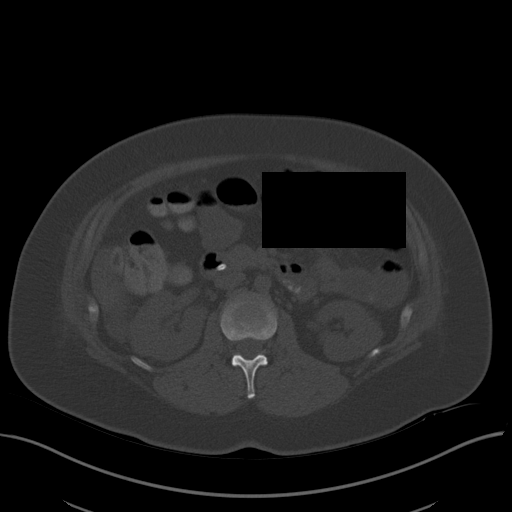
[im 11/67  soft-tissue]
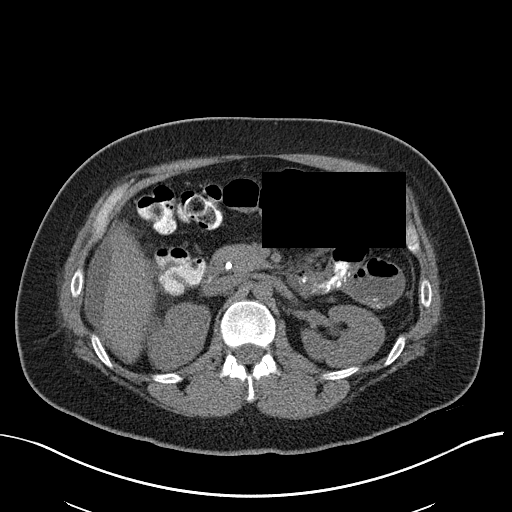
[im 14/67  soft-tissue]
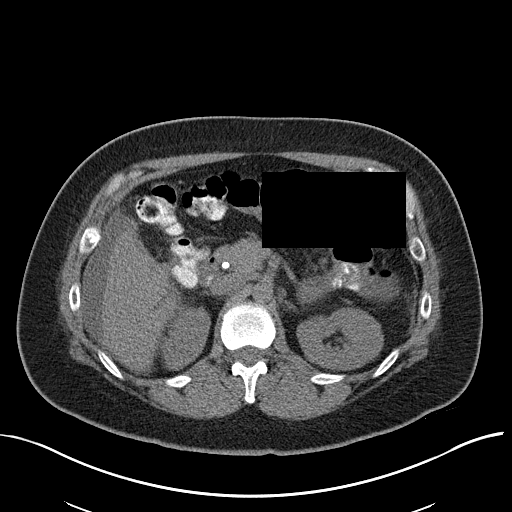
[im 18/67  soft-tissue]
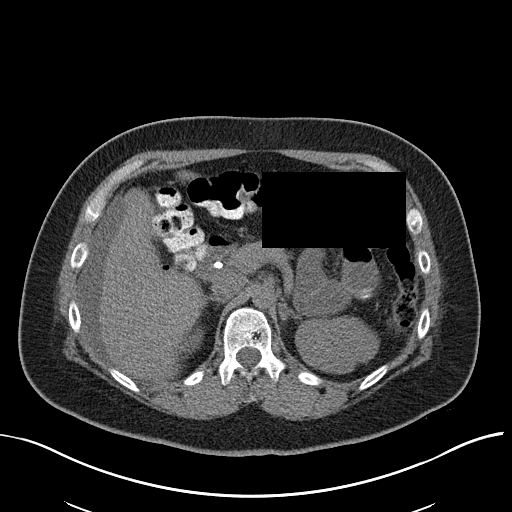
[im 25/67  soft-tissue]
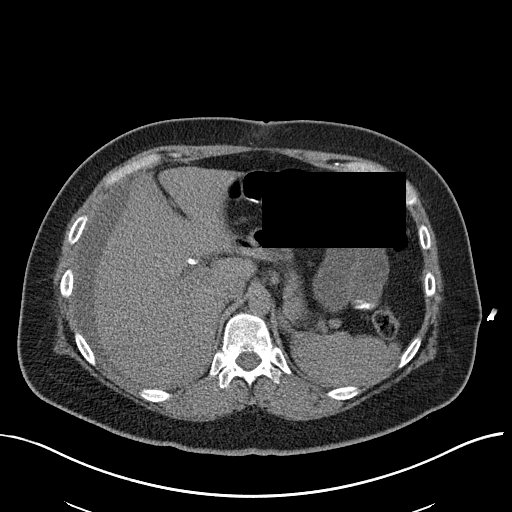
[im 28/67  soft-tissue]
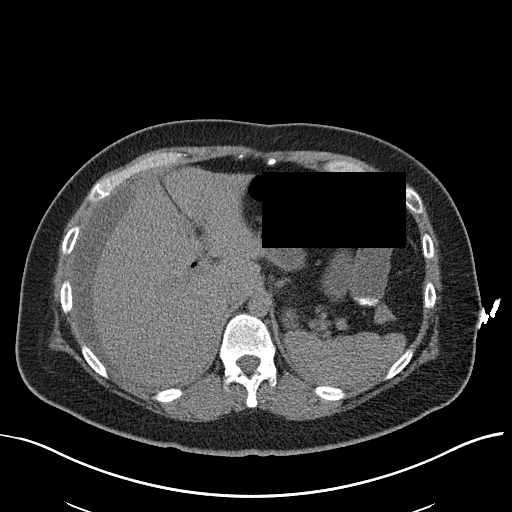
[im 35/67  soft-tissue]
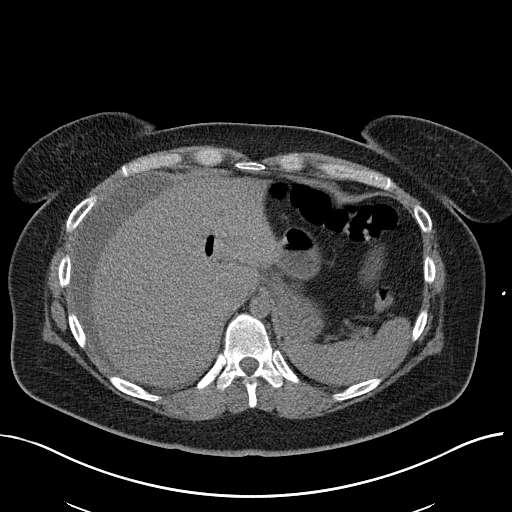
[im 39/67  soft-tissue]
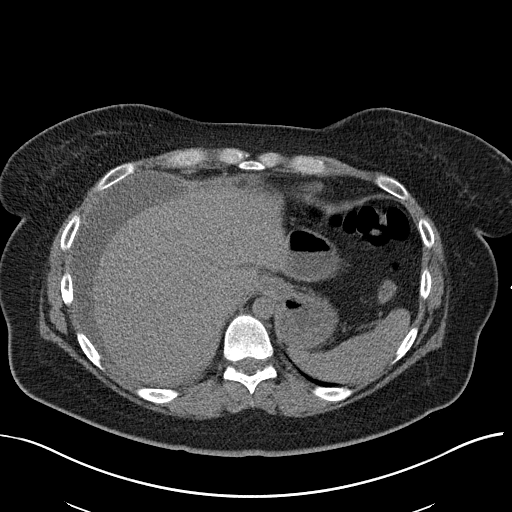
[im 42/67  soft-tissue]
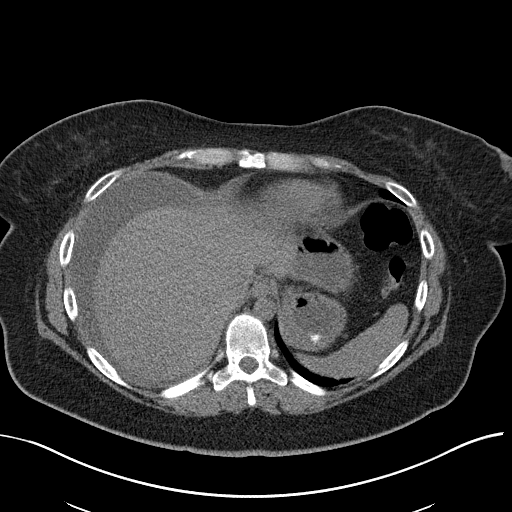
[im 42/67  bone]
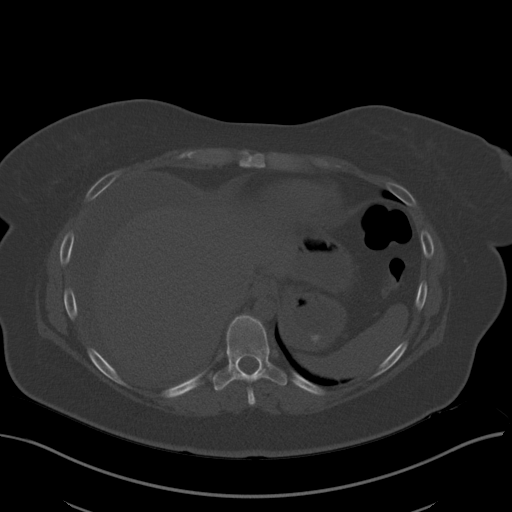
[im 49/67  soft-tissue]
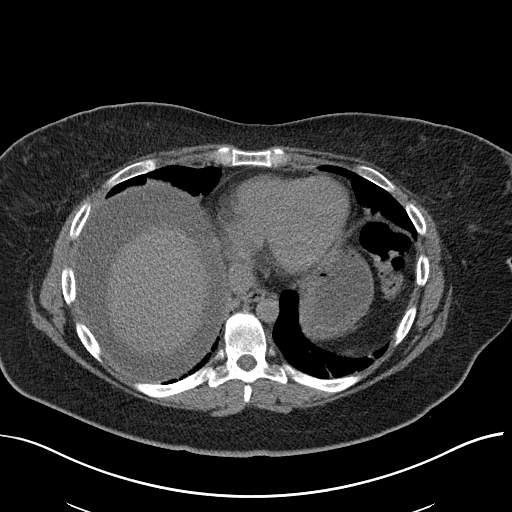
[im 53/67  soft-tissue]
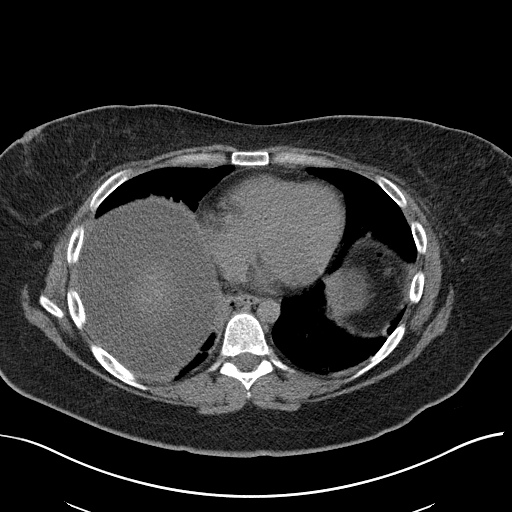
[im 53/67  lung]
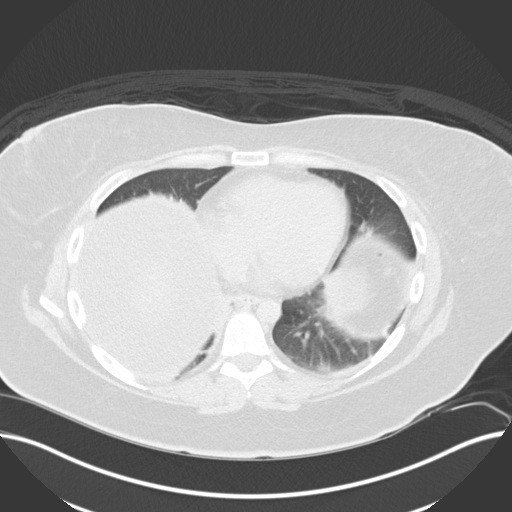
[im 56/67  soft-tissue]
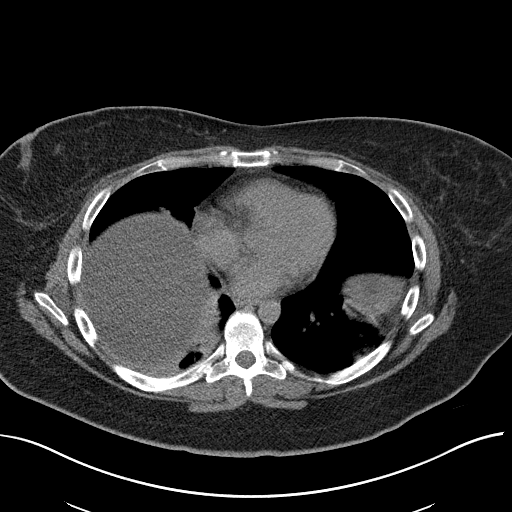
[im 56/67  lung]
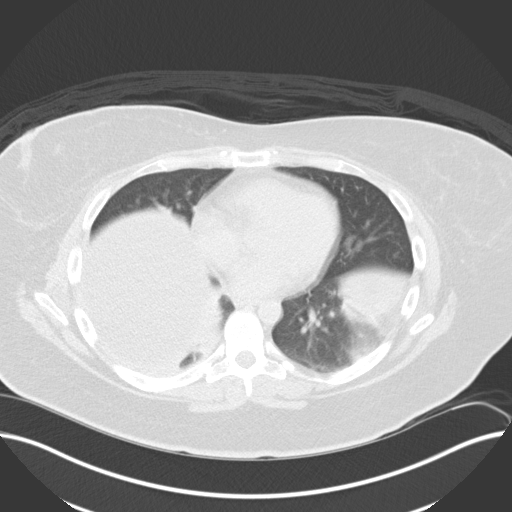
[im 60/67  lung]
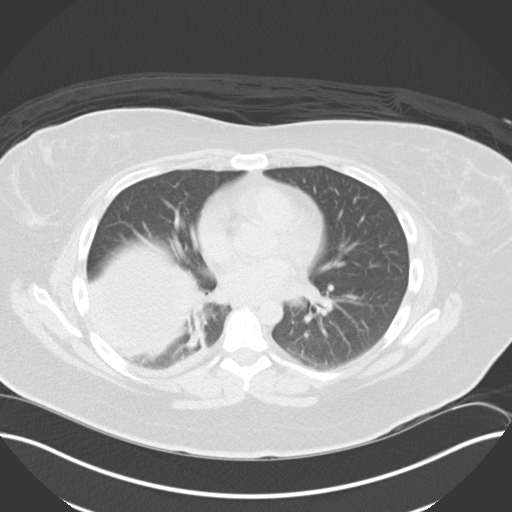
[im 63/67  soft-tissue]
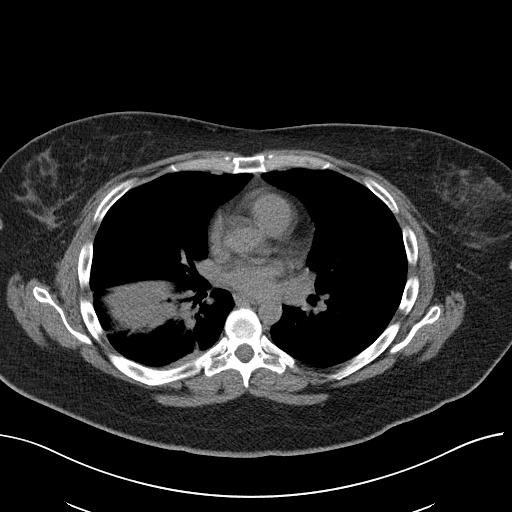
[im 63/67  lung]
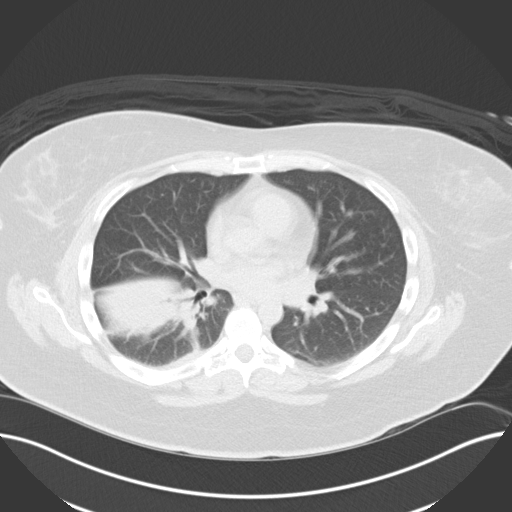

[14 of 32 positions shown; findings below may reference images not displayed]

EXAM:
CT GUIDED DRAINAGE OF RIGHT UPPER QUADRANT BILOMA

MEDICATIONS:
The patient is currently admitted to the hospital and receiving
intravenous antibiotics. The antibiotics were administered within an
appropriate time frame prior to the initiation of the procedure.

ANESTHESIA/SEDATION:
Three mg IV Versed 150 mcg IV Fentanyl

Moderate Sedation Time:  15 minutes

The patient was continuously monitored during the procedure by the
interventional radiology nurse under my direct supervision.

COMPLICATIONS:
None immediate.
PROCEDURE:
The right flank was prepped with ChloraPrep in a sterile fashion,
and a sterile drape was applied covering the operative field. A
sterile gown and sterile gloves were used for the procedure. Local
anesthesia was provided with 1% Lidocaine. Under CT guidance, an 18
gauge needle was advanced into the perihepatic biloma and removed
over an Amplatz wire. Ten French dilator followed by a 10 French
drain were inserted. It was looped and string fixed then sewn to the
skin. Bilious fluid was aspirated.
FINDINGS: Imaging documents placement of a 10 French pigtail catheter in a
perihepatic biloma.
IMPRESSION: Successful CT-guided 10 French biloma catheter placement.

## 2019-01-02 DIAGNOSIS — B9689 Other specified bacterial agents as the cause of diseases classified elsewhere: Secondary | ICD-10-CM | POA: Diagnosis not present

## 2019-01-02 DIAGNOSIS — N3000 Acute cystitis without hematuria: Secondary | ICD-10-CM | POA: Diagnosis not present

## 2019-01-12 DIAGNOSIS — Z1331 Encounter for screening for depression: Secondary | ICD-10-CM | POA: Diagnosis not present

## 2019-01-12 DIAGNOSIS — F419 Anxiety disorder, unspecified: Secondary | ICD-10-CM | POA: Diagnosis not present

## 2019-01-12 DIAGNOSIS — R202 Paresthesia of skin: Secondary | ICD-10-CM | POA: Diagnosis not present

## 2019-01-12 DIAGNOSIS — M791 Myalgia, unspecified site: Secondary | ICD-10-CM | POA: Diagnosis not present

## 2019-01-12 DIAGNOSIS — M6289 Other specified disorders of muscle: Secondary | ICD-10-CM | POA: Diagnosis not present

## 2019-02-27 DIAGNOSIS — F419 Anxiety disorder, unspecified: Secondary | ICD-10-CM | POA: Diagnosis not present

## 2019-03-14 ENCOUNTER — Emergency Department (HOSPITAL_COMMUNITY)
Admission: EM | Admit: 2019-03-14 | Discharge: 2019-03-15 | Disposition: A | Discharge status: Home / Self Care | Payer: BC Managed Care – PPO | Attending: Emergency Medicine | Admitting: Emergency Medicine

## 2019-03-14 ENCOUNTER — Encounter (HOSPITAL_COMMUNITY): Payer: Self-pay

## 2019-03-14 DIAGNOSIS — R569 Unspecified convulsions: Secondary | ICD-10-CM | POA: Insufficient documentation

## 2019-03-14 DIAGNOSIS — Z5321 Procedure and treatment not carried out due to patient leaving prior to being seen by health care provider: Secondary | ICD-10-CM | POA: Diagnosis not present

## 2019-03-14 LAB — CBC
HCT: 46.8 % — ABNORMAL HIGH (ref 36.0–46.0)
Hemoglobin: 16 g/dL — ABNORMAL HIGH (ref 12.0–15.0)
MCH: 31.1 pg (ref 26.0–34.0)
MCHC: 34.2 g/dL (ref 30.0–36.0)
MCV: 90.9 fL (ref 80.0–100.0)
Platelets: 286 10*3/uL (ref 150–400)
RBC: 5.15 MIL/uL — ABNORMAL HIGH (ref 3.87–5.11)
RDW: 12.7 % (ref 11.5–15.5)
WBC: 11.4 10*3/uL — ABNORMAL HIGH (ref 4.0–10.5)
nRBC: 0 % (ref 0.0–0.2)

## 2019-03-14 LAB — I-STAT BETA HCG BLOOD, ED (MC, WL, AP ONLY): I-stat hCG, quantitative: <5 m[IU]/mL (ref <5)

## 2019-03-14 LAB — BASIC METABOLIC PANEL
Anion gap: 13 (ref 5–15)
BUN: 6 mg/dL (ref 6–20)
CO2: 22 mmol/L (ref 22–32)
Calcium: 9.4 mg/dL (ref 8.9–10.3)
Chloride: 106 mmol/L (ref 98–111)
Creatinine, Ser: 0.79 mg/dL (ref 0.44–1.00)
GFR calc Af Amer: >60 mL/min (ref >60)
GFR calc non Af Amer: >60 mL/min (ref >60)
Glucose, Bld: 158 mg/dL — ABNORMAL HIGH (ref 70–99)
Potassium: 3.3 mmol/L — ABNORMAL LOW (ref 3.5–5.1)
Sodium: 141 mmol/L (ref 135–145)

## 2019-03-14 NOTE — ED Triage Notes (Signed)
Pt states that she thinks she may have had a seizure today, states she was in the car and her whole body "locked up on her" but states she never had LOC or a post ictal state, no hx of seizures

## 2019-03-15 NOTE — ED Notes (Signed)
Pt reassessed, assured her that none of her labs had come back life threatening.  Vitals stable at this time.  Pt decided to go home and try to get some rest.  Encouraged pt to follow up w/ her PCP in the AM and return should she need to.

## 2019-03-16 DIAGNOSIS — F445 Conversion disorder with seizures or convulsions: Secondary | ICD-10-CM | POA: Diagnosis not present

## 2019-03-16 DIAGNOSIS — Z87898 Personal history of other specified conditions: Secondary | ICD-10-CM | POA: Diagnosis not present

## 2019-03-16 DIAGNOSIS — F419 Anxiety disorder, unspecified: Secondary | ICD-10-CM | POA: Diagnosis not present

## 2019-04-19 ENCOUNTER — Encounter: Payer: Self-pay | Admitting: Neurology

## 2019-04-21 ENCOUNTER — Ambulatory Visit: Payer: BC Managed Care – PPO | Admitting: Neurology

## 2019-04-21 ENCOUNTER — Other Ambulatory Visit: Payer: Self-pay

## 2019-04-21 ENCOUNTER — Encounter: Payer: Self-pay | Admitting: Neurology

## 2019-04-21 VITALS — Temp 97.2°F | Ht 67.75 in | Wt 206.0 lb

## 2019-04-21 DIAGNOSIS — R404 Transient alteration of awareness: Secondary | ICD-10-CM

## 2019-04-21 NOTE — Patient Instructions (Signed)
Non-Epileptic Seizures, Adult A seizure can cause:  Involuntary movements, like falling or shaking, twitching.  Changes in awareness or consciousness, like a trance.  Convulsions. These are episodes of uncontrollable, jerking movement caused by sudden, intense tightening (contraction) of the muscles. Epileptic seizures are caused by abnormal electrical activity in the brain. Non-epileptic seizures are different. They are not caused by abnormal electrical signals in your brain. These seizures may look like epileptic seizures, but they are not caused by epilepsy. There are two types of non-epileptic seizures:  Physiologic non-epileptic seizure. This type results from an underlying problem that causes a disruption in your brain's electrical activity.  Psychogenic non-epileptic seizure. This type results from emotional stress. These seizures are sometimes called pseudoseizures. What are the causes? Causes of physiologic non-epileptic seizures can include:  Sudden drop in blood pressure.  Low blood sugar (glucose).  Low levels of salt (sodium) in your blood.  Low levels of calcium in your blood.  Migraine.  Heart rhythm problems.  Sleep disorders, such as narcolepsy.  Movement disorders, such as Tourette syndrome.  Infection.  Certain medicines.  Drug and alcohol abuse.  Fever. Common causes of psychogenic non-epileptic seizures can include:  Stress.  Emotional trauma.  Sexual or physical abuse.  Major life events, such as divorce or death of a loved one.  Mental health disorders, including anxiety and depression. What are the signs or symptoms? Symptoms of a non-epileptic seizure can be similar to those of an epileptic seizure, which may include:  A change in attention or behavior (altered mental status).  Loss of consciousness or fainting.  Convulsions with rhythmic jerking movements.  Drooling.  Rapid eye movements.  Grunting.  Loss of bladder control  and bowel control.  Bitter taste in the mouth.  Tongue biting. Some people experience unusual sensations (aura) before having a seizure. These can include:  "Butterflies" in the stomach.  Abnormal smells or tastes.  A feeling of having had a new experience before (dj vu). After a non-epileptic seizure, you may have a headache or sore muscles or feel confused and sleepy. Non-epileptic seizures usually:  Do not cause physical injuries.  Start slowly.  Include crying or shrieking.  Last longer than 2 minutes.  Include pelvic thrusting. How is this diagnosed? Non-epileptic seizures may be diagnosed by:  Your medical history.  A physical exam.  Your symptoms. ? Your health care provider may want to talk with your friends or relatives who have seen you have a seizure. ? If possible, it is helpful if you write down your seizure activity, including what led up to the seizure, and share that information with your health care provider. You may also need to have tests to look for causes of physiologic non-epileptic seizures. These may include:  An electroencephalogram (EEG). This test measures electrical activity in your brain. If you have had a non-epileptic seizure, the results of your EEG will likely be normal.  Video EEG. This test takes place in the hospital over the course of 2-7 days. The test uses a video camera and an EEG to monitor your symptoms and the electrical activity in your brain.  Blood tests.  Lumbar puncture. This test involves pulling fluid from your spine to check for infection.  Electrocardiogram (ECG or EKG). This test checks for an abnormal heart rhythm.  CT scan. If your health care provider thinks you have had a psychogenic non-epileptic seizure, you may need to see a mental health specialist for an evaluation. How is this treated?  The treatment for your seizures will depend on what is causing them. When the underlying condition is treated, your  seizures should stop. If your seizures are being caused by emotional trauma or stress, your health care provider may recommend that you see a mental health professional. Treatment may include:  Relaxation therapy or cognitive behavioral therapy.  Medicines to treat depression or anxiety.  Individual or family counseling. In some cases, you may have psychogenic seizures in addition to epileptic seizures. If this is the case, you may be prescribed medicine to help with the epileptic seizures. Follow these instructions at home: Home care will depend on the type of non-epileptic seizures that you have. In general:  Follow all instructions from your health care provider. These may include ways to prevent seizures and what to do if you have a seizure.  Take over-the-counter and prescription medicines only as told by your health care provider.  Keep all follow-up visits as told by your health care provider. This is important.  Make sure family members, friends, and coworkers are trained on how to help you if you have a seizure. If you have a seizure, they should: ? Lay you on the ground to prevent a fall. ? Place a pillow or piece of clothing under your head. ? Loosen any clothing around your neck. ? Turn you onto your side. If vomiting occurs, this helps keep your airway clear.  Avoid any substances that may prevent your medicine from working properly. If you are prescribed medicine for seizures: ? Do not use recreational drugs. ? Limit or avoid alcoholic beverages. Contact a health care provider if:  Your seizures change or become more frequent.  You continue to have seizures after treatment. Get help right away if:  You injure yourself during a seizure.  You have one seizure after another.  You have trouble recovering from a seizure.  You have chest pain or trouble breathing.  You have a seizure that lasts longer than 5 minutes. Summary  Non-epileptic seizures may look like  epileptic seizures, but they are not caused by epilepsy.  The treatment for your seizures will depend on what is causing them. When the underlying condition is treated, your seizures should stop.  Make sure family members, friends, and coworkers are trained on how to help you if you have a seizure. If you have a seizure, they should lay you on the ground to prevent a fall, protect your head and neck, and turn you onto your side. This information is not intended to replace advice given to you by your health care provider. Make sure you discuss any questions you have with your health care provider. Document Revised: 03/12/2017 Document Reviewed: 01/10/2016 Elsevier Patient Education  2020 [employer].

## 2019-04-21 NOTE — Progress Notes (Addendum)
Provider:  Melvyn Novas, MD  Primary Care Physician:  Marlyn Corporal, PA [address] [place] [address]     Referring Provider: Wenda Low, NP with  Dr Tomasa Blase        Chief Complaint according to patient   Patient presents with:    . New Patient (Initial Visit)           HISTORY OF PRESENT ILLNESS:  Brenda Luna is a 39 y.o. year old right-handed  Caucasian female with a possible seizure disorder.  She  has a past medical history of Anxiety, Biliary colic, Dysrhythmia, PCOS (polycystic ovarian syndrome), and Seizures (HCC) (2003).  She experiences anxiety and had panic attacks.   The patient reports that she suffered a first-time witnessed seizure after she had been placed on Wellbutrin in 2003.  Since October last year ( 01-2019) there have been some entries into her medical record records regarding anxiety and also an evaluation in September 2020 for a possible seizure. Seizure was ruled out as well as stroke and she was instead diagnosed with a UTI and treated with an antibiotic.   No imaging has been ordered at the time. She reportedly had more  muscle pain and tightness but the symptoms have not recurred.  She was then seen in the emergency room again in November 2020 at [facility].  Again diagnosed with a urinary tract infection.    Symptoms of concern : Tingling in the face and finally reading through her body, making her hands curl, and chest feels tight. No LOC, no convulsion, no incontinence. Palpitations-yes- and her eyes may twitching.   She has not been able to see her primary care in person /she had telehealth visits with the nurse practitioner at Dr. Manus Rudd office.  Past medical history for BMI over 30, anxiety, muscle pain and muscle stiffness depression and possible seizure 2003.   She was treated with Pristiq at this time 100 mg daily she may have had none epileptic seizures, she has been taking trazodone for insomnia since November and  it has helped.   She takes vitamin D multivitamin BC powders as needed Aleve as needed.  Social history she is married with 1 child, she is currently full-time Airline pilot ,working daytime hours.  She is an Film/video editor.  She drinks socially occasionally alcohol, she is a current every day smoker.   She owns 2 dogs.  She has cut down on caffeine intake to 2-3 coffees and 1 soda a day.  At the end of last year she consumed about  6 cups of coffee.  This may contribute to anxiety and panic attacks as well as to insomnia.     Review of Systems: Out of a complete 14 system review, the patient complains of only the following symptoms, and all other reviewed systems are negative.:   One documented seizure.  Fatigue,  snoring,  Insomnia  No nightmares.    How likely are you to doze in the following situations: 0 = not likely, 1 = slight chance, 2 = moderate chance, 3 = high chance   Sitting and Reading? 2  Watching Television? 2 Sitting inactive in a public place (theater or meeting)? As a passenger in a car for an hour without a break? Lying down in the afternoon when circumstances permit? Sitting and talking to someone? Sitting quietly after lunch without alcohol? In a car, while stopped for a few minutes in traffic?  Total =4/ 24 points   Social History   Socioeconomic History  . Marital status: Married    Spouse name: Not on file  . Number of children: Not on file  . Years of education: Not on file  . Highest education level: Not on file  Occupational History  . Not on file  Tobacco Use  . Smoking status: Current Every Day Smoker    Packs/day: 0.75    Years: 16.00    Pack years: 12.00    Types: Cigarettes  . Smokeless tobacco: Never Used  Substance and Sexual Activity  . Alcohol use: Yes    Comment: socially  . Drug use: No  . Sexual activity: Yes  Other Topics Concern  . Not on file  Social History Narrative  . Not on file   Social Determinants of Health    Financial Resource Strain:   . Difficulty of Paying Living Expenses: Not on file  Food Insecurity:   . Worried About Programme researcher, broadcasting/film/video in the Last Year: Not on file  . Ran Out of Food in the Last Year: Not on file  Transportation Needs:   . Lack of Transportation (Medical): Not on file  . Lack of Transportation (Non-Medical): Not on file  Physical Activity:   . Days of Exercise per Week: Not on file  . Minutes of Exercise per Session: Not on file  Stress:   . Feeling of Stress : Not on file  Social Connections:   . Frequency of Communication with Friends and Family: Not on file  . Frequency of Social Gatherings with Friends and Family: Not on file  . Attends Religious Services: Not on file  . Active Member of Clubs or Organizations: Not on file  . Attends Banker Meetings: Not on file  . Marital Status: Not on file    Family History  Problem Relation Age of Onset  . Gallbladder disease Mother   . Hypertension Father   . Diabetes Father   . Breast cancer Maternal Grandmother   . Diabetes Maternal Grandfather   . Lung cancer Paternal Grandfather     Past Medical History:  Diagnosis Date  . Anxiety   . Biliary colic   . Dysrhythmia    palpitations  . PCOS (polycystic ovarian syndrome)   . Seizures (HCC) 2003   "brought on by RX" (03/23/2017)    Past Surgical History:  Procedure Laterality Date  . ERCP N/A 03/26/2017   Procedure: ENDOSCOPIC RETROGRADE CHOLANGIOPANCREATOGRAPHY (ERCP);  Surgeon: Sherrilyn Rist, MD;  Location: [facility] ENDOSCOPY;  Service: Gastroenterology;  Laterality: N/A;  . ERCP N/A 04/28/2017   Procedure: ENDOSCOPIC RETROGRADE CHOLANGIOPANCREATOGRAPHY (ERCP);  Surgeon: Sherrilyn Rist, MD;  Location: [facility] ENDOSCOPY;  Service: Gastroenterology;  Laterality: N/A;  STENT REMOVAL  . LAPAROSCOPIC CHOLECYSTECTOMY  03/15/2017  . WISDOM TOOTH EXTRACTION       Current Outpatient Medications on File Prior to Visit  Medication Sig Dispense  Refill  . Aspirin-Caffeine (BC FAST PAIN RELIEF PO) Take by mouth daily as needed.    . desvenlafaxine (PRISTIQ) 100 MG 24 hr tablet Take 100 mg by mouth daily.     Marland Kitchen ibuprofen (ADVIL,MOTRIN) 200 MG tablet Take 400 mg by mouth every 6 (six) hours as needed for moderate pain.    . Multiple Vitamin (MULTIVITAMIN) tablet Take 1 tablet by mouth daily.    . traZODone (DESYREL) 50 MG tablet Take 25-50 mg by mouth at bedtime as needed for sleep.  No current facility-administered medications on file prior to visit.    Allergies  Allergen Reactions  . Wellbutrin [Bupropion] Other (See Comments)    SEIZURES    Physical exam:  Today's Vitals   04/21/19 0905  Temp: (!) 97.2 F (36.2 C)  Weight: 206 lb (93.4 kg)  Height: 5' 7.75" (1.721 m)   Body mass index is 31.55 kg/m.   Wt Readings from Last 3 Encounters:  04/21/19 206 lb (93.4 kg)  03/14/19 210 lb (95.3 kg)  04/28/17 198 lb (89.8 kg)     Ht Readings from Last 3 Encounters:  04/21/19 5' 7.75" (1.721 m)  03/14/19 5' 7.75" (1.721 m)  04/28/17 5' 7.75" (1.721 m)      General: The patient is awake, alert and appears not in acute distress. The patient is well groomed. Head: Normocephalic, atraumatic. Neck is supple. Cardiovascular:  Regular rate and cardiac rhythm by pulse,  without distended neck veins. Respiratory: Lungs are clear to auscultation.  Skin:  Without evidence of ankle edema, or rash. Trunk: The patient's posture is erect.   Neurologic exam : The patient is awake and alert, oriented to place and time.   Memory subjective described as intact.  Attention span & concentration ability appears normal.  Speech is fluent,  without  dysarthria, dysphonia or aphasia.  Mood and affect are appropriate.   Cranial nerves: no loss of smell or taste reported  Pupils are equal and briskly reactive to light. Funduscopic exam deferred.   Extraocular movements in vertical and horizontal planes were intact but with endpoint  nystagmus ( can be physiological) .  No Diplopia. Astigmatism is present.  Visual fields by finger perimetry are intact. Hearing was intact to soft voice and finger rubbing.    Facial sensation intact to fine touch.  Facial motor strength is symmetric and tongue and uvula move midline.  Neck ROM : rotation, tilt and flexion extension were normal for age and shoulder shrug was symmetrical.    Motor exam:  Symmetric bulk, tone and ROM.   Normal tone without cog wheeling, symmetric grip strength .   Sensory:  Fine touch, pinprick and vibration were tested  and  normal.  Proprioception tested in the upper extremities was normal.   Coordination: Rapid alternating movements in the fingers/hands were of normal speed.  The Finger-to-nose maneuver was intact without evidence of ataxia, dysmetria or tremor.   Gait and station: Patient could rise unassisted from a seated position, walked without assistive device.  Stance is of normal width/ base and the patient turned with 3 steps.  Toe and heel walk were deferred.  Deep tendon reflexes: in the  upper and lower extremities are symmetric and intact.  Babinski response was deferred .      After spending a total time of 35  minutes face to face and additional 15 minutes time for physical and neurologic examination, review of laboratory studies,  personal review of imaging studies, reports and results of other testing and review of referral information / records as far as provided in visit, I have established the following assessments:  1)  Spells were too long to constitute seizures and had no associated  Movement , no incontinence and no LOC. 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  3) single seizure in 2003 was provoked and would not require medication.    My  Plan is to proceed with:  MRI brain-   1) breathing exercises, anxiety relief  through "boxed breathing"  2) have an emergency plan- this can be low dose xanax , it can be just drinking water. Avoid going full panic.  3) get rest, sleep 7 hours, not longer, take power naps if needed.  4) Reduce caffeine after lunch.   I would like to thank Renaldo Reel, PA and Maylon Cos, [address],  [place] [address] for allowing me to meet with and to take care of this pleasant patient.   No return necessary if MRI brain not positive.    Electronically signed by: Larey Seat, MD 04/21/2019 10:03 AM  [facility] Board certified by The [employer] of Sleep Medicine and Diplomate of the [employer] of Sleep Medicine. Board certified In Neurology through the [employer], Fellow of the [employer] of Neurology. Medical Director of [facility].   Addendum MRI brain normal- patient released to PCP.

## 2019-05-02 ENCOUNTER — Telehealth: Payer: Self-pay

## 2019-05-02 MED ORDER — ALPRAZOLAM 0.5 MG PO TABS
0.5000 mg | ORAL_TABLET | Freq: Every evening | ORAL | 0 refills | Status: AC | PRN
Start: 2019-05-02 — End: ?

## 2019-05-02 NOTE — Telephone Encounter (Signed)
Called the patient and explained order was sent for her to the pharmacy. Advised 30 min prior to apt and then if needed again at time of apt

## 2019-05-02 NOTE — Telephone Encounter (Signed)
Done

## 2019-05-02 NOTE — Telephone Encounter (Signed)
Patient will need medication for MRI to help with claustrophobia.

## 2019-05-09 ENCOUNTER — Other Ambulatory Visit: Payer: Self-pay

## 2019-05-09 ENCOUNTER — Ambulatory Visit: Payer: BC Managed Care – PPO

## 2019-05-09 DIAGNOSIS — R404 Transient alteration of awareness: Secondary | ICD-10-CM

## 2019-05-10 ENCOUNTER — Telehealth: Payer: Self-pay | Admitting: Neurology

## 2019-05-10 NOTE — Telephone Encounter (Signed)
-----   Message from Melvyn Novas, MD sent at 05/10/2019  9:06 AM EST ----- Normal MRI brain-

## 2019-05-10 NOTE — Progress Notes (Signed)
Normal MRI brain.

## 2019-05-10 NOTE — Telephone Encounter (Signed)
Called the patient to advise of the MRI findings. There was no answer. Unable to leave a message due to VM is full.   If pt calls back please advise the patient that MRI was normal.

## 2019-05-11 NOTE — Telephone Encounter (Signed)
Pt has called and the message from [place], [place] was relayed to her.  Pt said her primary Dr told her that pending the results of the MRI it would be the Dr here to release her to begin driving again.  Pt is asking for a call to discuss.

## 2019-05-12 ENCOUNTER — Telehealth: Payer: Self-pay | Admitting: Neurology

## 2019-05-12 NOTE — Telephone Encounter (Signed)
MRI was negative, her history of  time/ clinical account of spells make seizures highly unlikely. 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

## 2019-05-15 NOTE — Telephone Encounter (Signed)
Called the patient and advised that Dr Vickey Huger reviewed her question in relation to being able to drive. Advised that Dr Vickey Huger has released the patient to drive from her standpoint. Patient verbalized understanding. She will let me know if anything else is further needed moving forward.

## 2019-05-16 NOTE — Telephone Encounter (Signed)
Patient was released to drive.

## 2019-09-28 DIAGNOSIS — F603 Borderline personality disorder: Secondary | ICD-10-CM | POA: Diagnosis not present

## 2019-10-04 DIAGNOSIS — Z6829 Body mass index (BMI) 29.0-29.9, adult: Secondary | ICD-10-CM | POA: Diagnosis not present

## 2019-10-04 DIAGNOSIS — F419 Anxiety disorder, unspecified: Secondary | ICD-10-CM | POA: Diagnosis not present

## 2019-10-04 DIAGNOSIS — Z3490 Encounter for supervision of normal pregnancy, unspecified, unspecified trimester: Secondary | ICD-10-CM | POA: Diagnosis not present

## 2019-10-04 DIAGNOSIS — F603 Borderline personality disorder: Secondary | ICD-10-CM | POA: Diagnosis not present

## 2019-10-12 DIAGNOSIS — N912 Amenorrhea, unspecified: Secondary | ICD-10-CM | POA: Diagnosis not present

## 2019-10-12 DIAGNOSIS — Z113 Encounter for screening for infections with a predominantly sexual mode of transmission: Secondary | ICD-10-CM | POA: Diagnosis not present

## 2019-10-12 DIAGNOSIS — F419 Anxiety disorder, unspecified: Secondary | ICD-10-CM | POA: Diagnosis not present

## 2019-10-12 DIAGNOSIS — Z3491 Encounter for supervision of normal pregnancy, unspecified, first trimester: Secondary | ICD-10-CM | POA: Diagnosis not present

## 2019-10-12 DIAGNOSIS — Z118 Encounter for screening for other infectious and parasitic diseases: Secondary | ICD-10-CM | POA: Diagnosis not present

## 2019-10-12 DIAGNOSIS — O3680X1 Pregnancy with inconclusive fetal viability, fetus 1: Secondary | ICD-10-CM | POA: Diagnosis not present

## 2019-10-17 DIAGNOSIS — F603 Borderline personality disorder: Secondary | ICD-10-CM | POA: Diagnosis not present

## 2019-10-24 DIAGNOSIS — F603 Borderline personality disorder: Secondary | ICD-10-CM | POA: Diagnosis not present

## 2019-10-31 DIAGNOSIS — F603 Borderline personality disorder: Secondary | ICD-10-CM | POA: Diagnosis not present

## 2019-11-07 DIAGNOSIS — F603 Borderline personality disorder: Secondary | ICD-10-CM | POA: Diagnosis not present

## 2019-11-13 DIAGNOSIS — Z3491 Encounter for supervision of normal pregnancy, unspecified, first trimester: Secondary | ICD-10-CM | POA: Diagnosis not present

## 2019-11-13 DIAGNOSIS — O3680X1 Pregnancy with inconclusive fetal viability, fetus 1: Secondary | ICD-10-CM | POA: Diagnosis not present

## 2019-11-14 DIAGNOSIS — F603 Borderline personality disorder: Secondary | ICD-10-CM | POA: Diagnosis not present

## 2019-11-21 DIAGNOSIS — F603 Borderline personality disorder: Secondary | ICD-10-CM | POA: Diagnosis not present

## 2019-11-28 DIAGNOSIS — F603 Borderline personality disorder: Secondary | ICD-10-CM | POA: Diagnosis not present

## 2019-12-05 DIAGNOSIS — F603 Borderline personality disorder: Secondary | ICD-10-CM | POA: Diagnosis not present

## 2019-12-12 DIAGNOSIS — F603 Borderline personality disorder: Secondary | ICD-10-CM | POA: Diagnosis not present

## 2019-12-18 DIAGNOSIS — F603 Borderline personality disorder: Secondary | ICD-10-CM | POA: Diagnosis not present

## 2019-12-22 DIAGNOSIS — Z1379 Encounter for other screening for genetic and chromosomal anomalies: Secondary | ICD-10-CM | POA: Diagnosis not present

## 2019-12-26 DIAGNOSIS — F603 Borderline personality disorder: Secondary | ICD-10-CM | POA: Diagnosis not present

## 2020-01-02 DIAGNOSIS — F603 Borderline personality disorder: Secondary | ICD-10-CM | POA: Diagnosis not present

## 2020-01-04 DIAGNOSIS — O2692 Pregnancy related conditions, unspecified, second trimester: Secondary | ICD-10-CM | POA: Diagnosis not present

## 2020-01-09 DIAGNOSIS — F603 Borderline personality disorder: Secondary | ICD-10-CM | POA: Diagnosis not present

## 2020-01-24 DIAGNOSIS — F603 Borderline personality disorder: Secondary | ICD-10-CM | POA: Diagnosis not present

## 2020-02-07 DIAGNOSIS — F4322 Adjustment disorder with anxiety: Secondary | ICD-10-CM | POA: Diagnosis not present

## 2020-02-13 DIAGNOSIS — Z3492 Encounter for supervision of normal pregnancy, unspecified, second trimester: Secondary | ICD-10-CM | POA: Diagnosis not present

## 2020-02-19 DIAGNOSIS — O24410 Gestational diabetes mellitus in pregnancy, diet controlled: Secondary | ICD-10-CM | POA: Diagnosis not present

## 2020-02-26 DIAGNOSIS — O24410 Gestational diabetes mellitus in pregnancy, diet controlled: Secondary | ICD-10-CM | POA: Diagnosis not present

## 2020-02-26 DIAGNOSIS — Z3A00 Weeks of gestation of pregnancy not specified: Secondary | ICD-10-CM | POA: Diagnosis not present

## 2020-03-06 DIAGNOSIS — F4325 Adjustment disorder with mixed disturbance of emotions and conduct: Secondary | ICD-10-CM | POA: Diagnosis not present

## 2020-03-14 DIAGNOSIS — O3663X1 Maternal care for excessive fetal growth, third trimester, fetus 1: Secondary | ICD-10-CM | POA: Diagnosis not present

## 2020-03-18 DIAGNOSIS — O24410 Gestational diabetes mellitus in pregnancy, diet controlled: Secondary | ICD-10-CM | POA: Diagnosis not present

## 2020-03-18 DIAGNOSIS — Z3A00 Weeks of gestation of pregnancy not specified: Secondary | ICD-10-CM | POA: Diagnosis not present

## 2020-03-19 DIAGNOSIS — F4325 Adjustment disorder with mixed disturbance of emotions and conduct: Secondary | ICD-10-CM | POA: Diagnosis not present

## 2020-04-03 DIAGNOSIS — F4325 Adjustment disorder with mixed disturbance of emotions and conduct: Secondary | ICD-10-CM | POA: Diagnosis not present

## 2020-04-10 DIAGNOSIS — O24419 Gestational diabetes mellitus in pregnancy, unspecified control: Secondary | ICD-10-CM | POA: Diagnosis not present

## 2020-04-25 DIAGNOSIS — O3663X1 Maternal care for excessive fetal growth, third trimester, fetus 1: Secondary | ICD-10-CM | POA: Diagnosis not present

## 2020-05-09 DIAGNOSIS — Z3483 Encounter for supervision of other normal pregnancy, third trimester: Secondary | ICD-10-CM | POA: Diagnosis not present

## 2020-05-09 DIAGNOSIS — Z113 Encounter for screening for infections with a predominantly sexual mode of transmission: Secondary | ICD-10-CM | POA: Diagnosis not present

## 2020-05-09 DIAGNOSIS — Z118 Encounter for screening for other infectious and parasitic diseases: Secondary | ICD-10-CM | POA: Diagnosis not present

## 2020-05-09 DIAGNOSIS — O3663X1 Maternal care for excessive fetal growth, third trimester, fetus 1: Secondary | ICD-10-CM | POA: Diagnosis not present

## 2020-05-16 DIAGNOSIS — O3663X1 Maternal care for excessive fetal growth, third trimester, fetus 1: Secondary | ICD-10-CM | POA: Diagnosis not present

## 2020-05-16 DIAGNOSIS — Z3A38 38 weeks gestation of pregnancy: Secondary | ICD-10-CM | POA: Diagnosis not present

## 2020-05-27 DIAGNOSIS — O24424 Gestational diabetes mellitus in childbirth, insulin controlled: Secondary | ICD-10-CM | POA: Diagnosis not present

## 2020-05-27 DIAGNOSIS — Z0110 Encounter for examination of ears and hearing without abnormal findings: Secondary | ICD-10-CM | POA: Diagnosis not present

## 2020-05-27 DIAGNOSIS — Z3A39 39 weeks gestation of pregnancy: Secondary | ICD-10-CM | POA: Diagnosis not present

## 2020-07-01 DIAGNOSIS — F419 Anxiety disorder, unspecified: Secondary | ICD-10-CM | POA: Diagnosis not present

## 2020-07-01 DIAGNOSIS — Z124 Encounter for screening for malignant neoplasm of cervix: Secondary | ICD-10-CM | POA: Diagnosis not present

## 2020-07-01 DIAGNOSIS — Z1151 Encounter for screening for human papillomavirus (HPV): Secondary | ICD-10-CM | POA: Diagnosis not present

## 2020-08-02 DIAGNOSIS — F419 Anxiety disorder, unspecified: Secondary | ICD-10-CM | POA: Diagnosis not present

## 2020-09-27 DIAGNOSIS — F419 Anxiety disorder, unspecified: Secondary | ICD-10-CM | POA: Diagnosis not present

## 2020-09-30 DIAGNOSIS — Z20822 Contact with and (suspected) exposure to covid-19: Secondary | ICD-10-CM | POA: Diagnosis not present

## 2021-04-03 DIAGNOSIS — J208 Acute bronchitis due to other specified organisms: Secondary | ICD-10-CM | POA: Diagnosis not present

## 2021-04-03 DIAGNOSIS — B9689 Other specified bacterial agents as the cause of diseases classified elsewhere: Secondary | ICD-10-CM | POA: Diagnosis not present

## 2022-02-09 DIAGNOSIS — Z23 Encounter for immunization: Secondary | ICD-10-CM | POA: Diagnosis not present

## 2022-02-09 DIAGNOSIS — L989 Disorder of the skin and subcutaneous tissue, unspecified: Secondary | ICD-10-CM | POA: Diagnosis not present

## 2022-02-09 DIAGNOSIS — Z1331 Encounter for screening for depression: Secondary | ICD-10-CM | POA: Diagnosis not present

## 2022-02-09 DIAGNOSIS — L719 Rosacea, unspecified: Secondary | ICD-10-CM | POA: Diagnosis not present

## 2022-02-09 DIAGNOSIS — L918 Other hypertrophic disorders of the skin: Secondary | ICD-10-CM | POA: Diagnosis not present

## 2022-02-27 DIAGNOSIS — J208 Acute bronchitis due to other specified organisms: Secondary | ICD-10-CM | POA: Diagnosis not present

## 2022-02-27 DIAGNOSIS — R399 Unspecified symptoms and signs involving the genitourinary system: Secondary | ICD-10-CM | POA: Diagnosis not present

## 2022-03-30 DIAGNOSIS — B079 Viral wart, unspecified: Secondary | ICD-10-CM | POA: Diagnosis not present

## 2022-03-30 DIAGNOSIS — L739 Follicular disorder, unspecified: Secondary | ICD-10-CM | POA: Diagnosis not present

## 2022-12-31 DIAGNOSIS — M531 Cervicobrachial syndrome: Secondary | ICD-10-CM | POA: Diagnosis not present

## 2022-12-31 DIAGNOSIS — M9902 Segmental and somatic dysfunction of thoracic region: Secondary | ICD-10-CM | POA: Diagnosis not present

## 2022-12-31 DIAGNOSIS — M9901 Segmental and somatic dysfunction of cervical region: Secondary | ICD-10-CM | POA: Diagnosis not present

## 2022-12-31 DIAGNOSIS — M5481 Occipital neuralgia: Secondary | ICD-10-CM | POA: Diagnosis not present

## 2023-01-04 DIAGNOSIS — M5481 Occipital neuralgia: Secondary | ICD-10-CM | POA: Diagnosis not present

## 2023-01-04 DIAGNOSIS — M531 Cervicobrachial syndrome: Secondary | ICD-10-CM | POA: Diagnosis not present

## 2023-01-04 DIAGNOSIS — M9902 Segmental and somatic dysfunction of thoracic region: Secondary | ICD-10-CM | POA: Diagnosis not present

## 2023-01-04 DIAGNOSIS — M9901 Segmental and somatic dysfunction of cervical region: Secondary | ICD-10-CM | POA: Diagnosis not present

## 2023-01-06 DIAGNOSIS — M531 Cervicobrachial syndrome: Secondary | ICD-10-CM | POA: Diagnosis not present

## 2023-01-06 DIAGNOSIS — M5481 Occipital neuralgia: Secondary | ICD-10-CM | POA: Diagnosis not present

## 2023-01-06 DIAGNOSIS — M9901 Segmental and somatic dysfunction of cervical region: Secondary | ICD-10-CM | POA: Diagnosis not present

## 2023-01-06 DIAGNOSIS — M9902 Segmental and somatic dysfunction of thoracic region: Secondary | ICD-10-CM | POA: Diagnosis not present

## 2023-01-12 DIAGNOSIS — M5481 Occipital neuralgia: Secondary | ICD-10-CM | POA: Diagnosis not present

## 2023-01-12 DIAGNOSIS — M9902 Segmental and somatic dysfunction of thoracic region: Secondary | ICD-10-CM | POA: Diagnosis not present

## 2023-01-12 DIAGNOSIS — M531 Cervicobrachial syndrome: Secondary | ICD-10-CM | POA: Diagnosis not present

## 2023-01-12 DIAGNOSIS — M9901 Segmental and somatic dysfunction of cervical region: Secondary | ICD-10-CM | POA: Diagnosis not present

## 2023-01-15 DIAGNOSIS — M9901 Segmental and somatic dysfunction of cervical region: Secondary | ICD-10-CM | POA: Diagnosis not present

## 2023-01-15 DIAGNOSIS — M5481 Occipital neuralgia: Secondary | ICD-10-CM | POA: Diagnosis not present

## 2023-01-15 DIAGNOSIS — M9902 Segmental and somatic dysfunction of thoracic region: Secondary | ICD-10-CM | POA: Diagnosis not present

## 2023-01-15 DIAGNOSIS — M531 Cervicobrachial syndrome: Secondary | ICD-10-CM | POA: Diagnosis not present

## 2023-01-19 DIAGNOSIS — M5481 Occipital neuralgia: Secondary | ICD-10-CM | POA: Diagnosis not present

## 2023-01-19 DIAGNOSIS — M9901 Segmental and somatic dysfunction of cervical region: Secondary | ICD-10-CM | POA: Diagnosis not present

## 2023-01-19 DIAGNOSIS — M9902 Segmental and somatic dysfunction of thoracic region: Secondary | ICD-10-CM | POA: Diagnosis not present

## 2023-01-19 DIAGNOSIS — M531 Cervicobrachial syndrome: Secondary | ICD-10-CM | POA: Diagnosis not present

## 2023-01-22 DIAGNOSIS — M9902 Segmental and somatic dysfunction of thoracic region: Secondary | ICD-10-CM | POA: Diagnosis not present

## 2023-01-22 DIAGNOSIS — M5481 Occipital neuralgia: Secondary | ICD-10-CM | POA: Diagnosis not present

## 2023-01-22 DIAGNOSIS — M9901 Segmental and somatic dysfunction of cervical region: Secondary | ICD-10-CM | POA: Diagnosis not present

## 2023-01-22 DIAGNOSIS — M531 Cervicobrachial syndrome: Secondary | ICD-10-CM | POA: Diagnosis not present

## 2023-01-27 DIAGNOSIS — M9901 Segmental and somatic dysfunction of cervical region: Secondary | ICD-10-CM | POA: Diagnosis not present

## 2023-01-27 DIAGNOSIS — M5481 Occipital neuralgia: Secondary | ICD-10-CM | POA: Diagnosis not present

## 2023-01-27 DIAGNOSIS — M531 Cervicobrachial syndrome: Secondary | ICD-10-CM | POA: Diagnosis not present

## 2023-01-27 DIAGNOSIS — M9902 Segmental and somatic dysfunction of thoracic region: Secondary | ICD-10-CM | POA: Diagnosis not present

## 2023-01-29 DIAGNOSIS — M5481 Occipital neuralgia: Secondary | ICD-10-CM | POA: Diagnosis not present

## 2023-01-29 DIAGNOSIS — M9902 Segmental and somatic dysfunction of thoracic region: Secondary | ICD-10-CM | POA: Diagnosis not present

## 2023-01-29 DIAGNOSIS — M531 Cervicobrachial syndrome: Secondary | ICD-10-CM | POA: Diagnosis not present

## 2023-01-29 DIAGNOSIS — M9901 Segmental and somatic dysfunction of cervical region: Secondary | ICD-10-CM | POA: Diagnosis not present

## 2023-02-04 DIAGNOSIS — M531 Cervicobrachial syndrome: Secondary | ICD-10-CM | POA: Diagnosis not present

## 2023-02-04 DIAGNOSIS — M9901 Segmental and somatic dysfunction of cervical region: Secondary | ICD-10-CM | POA: Diagnosis not present

## 2023-02-04 DIAGNOSIS — M9902 Segmental and somatic dysfunction of thoracic region: Secondary | ICD-10-CM | POA: Diagnosis not present

## 2023-02-04 DIAGNOSIS — M5481 Occipital neuralgia: Secondary | ICD-10-CM | POA: Diagnosis not present

## 2023-02-09 DIAGNOSIS — M9902 Segmental and somatic dysfunction of thoracic region: Secondary | ICD-10-CM | POA: Diagnosis not present

## 2023-02-09 DIAGNOSIS — M531 Cervicobrachial syndrome: Secondary | ICD-10-CM | POA: Diagnosis not present

## 2023-02-09 DIAGNOSIS — M5481 Occipital neuralgia: Secondary | ICD-10-CM | POA: Diagnosis not present

## 2023-02-09 DIAGNOSIS — M9901 Segmental and somatic dysfunction of cervical region: Secondary | ICD-10-CM | POA: Diagnosis not present

## 2023-02-15 DIAGNOSIS — M531 Cervicobrachial syndrome: Secondary | ICD-10-CM | POA: Diagnosis not present

## 2023-02-15 DIAGNOSIS — M9901 Segmental and somatic dysfunction of cervical region: Secondary | ICD-10-CM | POA: Diagnosis not present

## 2023-02-15 DIAGNOSIS — M5481 Occipital neuralgia: Secondary | ICD-10-CM | POA: Diagnosis not present

## 2023-02-15 DIAGNOSIS — M9902 Segmental and somatic dysfunction of thoracic region: Secondary | ICD-10-CM | POA: Diagnosis not present

## 2023-02-24 DIAGNOSIS — M9901 Segmental and somatic dysfunction of cervical region: Secondary | ICD-10-CM | POA: Diagnosis not present

## 2023-02-24 DIAGNOSIS — M5481 Occipital neuralgia: Secondary | ICD-10-CM | POA: Diagnosis not present

## 2023-02-24 DIAGNOSIS — M531 Cervicobrachial syndrome: Secondary | ICD-10-CM | POA: Diagnosis not present

## 2023-02-24 DIAGNOSIS — M9902 Segmental and somatic dysfunction of thoracic region: Secondary | ICD-10-CM | POA: Diagnosis not present

## 2023-03-10 DIAGNOSIS — M9902 Segmental and somatic dysfunction of thoracic region: Secondary | ICD-10-CM | POA: Diagnosis not present

## 2023-03-10 DIAGNOSIS — M9901 Segmental and somatic dysfunction of cervical region: Secondary | ICD-10-CM | POA: Diagnosis not present

## 2023-03-10 DIAGNOSIS — M5481 Occipital neuralgia: Secondary | ICD-10-CM | POA: Diagnosis not present

## 2023-03-10 DIAGNOSIS — M531 Cervicobrachial syndrome: Secondary | ICD-10-CM | POA: Diagnosis not present

## 2023-04-09 DIAGNOSIS — M9902 Segmental and somatic dysfunction of thoracic region: Secondary | ICD-10-CM | POA: Diagnosis not present

## 2023-04-09 DIAGNOSIS — M531 Cervicobrachial syndrome: Secondary | ICD-10-CM | POA: Diagnosis not present

## 2023-04-09 DIAGNOSIS — M5481 Occipital neuralgia: Secondary | ICD-10-CM | POA: Diagnosis not present

## 2023-04-09 DIAGNOSIS — M9901 Segmental and somatic dysfunction of cervical region: Secondary | ICD-10-CM | POA: Diagnosis not present

## 2023-05-07 DIAGNOSIS — M531 Cervicobrachial syndrome: Secondary | ICD-10-CM | POA: Diagnosis not present

## 2023-05-07 DIAGNOSIS — M9901 Segmental and somatic dysfunction of cervical region: Secondary | ICD-10-CM | POA: Diagnosis not present

## 2023-05-07 DIAGNOSIS — M5481 Occipital neuralgia: Secondary | ICD-10-CM | POA: Diagnosis not present

## 2023-05-07 DIAGNOSIS — M9902 Segmental and somatic dysfunction of thoracic region: Secondary | ICD-10-CM | POA: Diagnosis not present

## 2023-06-04 DIAGNOSIS — M5481 Occipital neuralgia: Secondary | ICD-10-CM | POA: Diagnosis not present

## 2023-06-04 DIAGNOSIS — M531 Cervicobrachial syndrome: Secondary | ICD-10-CM | POA: Diagnosis not present

## 2023-06-04 DIAGNOSIS — M9901 Segmental and somatic dysfunction of cervical region: Secondary | ICD-10-CM | POA: Diagnosis not present

## 2023-06-04 DIAGNOSIS — M9902 Segmental and somatic dysfunction of thoracic region: Secondary | ICD-10-CM | POA: Diagnosis not present

## 2023-06-17 DIAGNOSIS — F4323 Adjustment disorder with mixed anxiety and depressed mood: Secondary | ICD-10-CM | POA: Diagnosis not present

## 2023-06-24 DIAGNOSIS — F4323 Adjustment disorder with mixed anxiety and depressed mood: Secondary | ICD-10-CM | POA: Diagnosis not present

## 2023-07-01 DIAGNOSIS — F4323 Adjustment disorder with mixed anxiety and depressed mood: Secondary | ICD-10-CM | POA: Diagnosis not present

## 2023-07-02 DIAGNOSIS — M531 Cervicobrachial syndrome: Secondary | ICD-10-CM | POA: Diagnosis not present

## 2023-07-02 DIAGNOSIS — M9901 Segmental and somatic dysfunction of cervical region: Secondary | ICD-10-CM | POA: Diagnosis not present

## 2023-07-02 DIAGNOSIS — M5481 Occipital neuralgia: Secondary | ICD-10-CM | POA: Diagnosis not present

## 2023-07-02 DIAGNOSIS — M9902 Segmental and somatic dysfunction of thoracic region: Secondary | ICD-10-CM | POA: Diagnosis not present

## 2023-07-08 DIAGNOSIS — F4323 Adjustment disorder with mixed anxiety and depressed mood: Secondary | ICD-10-CM | POA: Diagnosis not present

## 2023-07-22 DIAGNOSIS — F4323 Adjustment disorder with mixed anxiety and depressed mood: Secondary | ICD-10-CM | POA: Diagnosis not present

## 2023-07-29 DIAGNOSIS — F4323 Adjustment disorder with mixed anxiety and depressed mood: Secondary | ICD-10-CM | POA: Diagnosis not present

## 2023-07-30 DIAGNOSIS — M531 Cervicobrachial syndrome: Secondary | ICD-10-CM | POA: Diagnosis not present

## 2023-07-30 DIAGNOSIS — M5481 Occipital neuralgia: Secondary | ICD-10-CM | POA: Diagnosis not present

## 2023-07-30 DIAGNOSIS — M9902 Segmental and somatic dysfunction of thoracic region: Secondary | ICD-10-CM | POA: Diagnosis not present

## 2023-07-30 DIAGNOSIS — M9901 Segmental and somatic dysfunction of cervical region: Secondary | ICD-10-CM | POA: Diagnosis not present

## 2023-08-05 DIAGNOSIS — F4323 Adjustment disorder with mixed anxiety and depressed mood: Secondary | ICD-10-CM | POA: Diagnosis not present

## 2023-08-12 DIAGNOSIS — F4323 Adjustment disorder with mixed anxiety and depressed mood: Secondary | ICD-10-CM | POA: Diagnosis not present

## 2023-08-26 DIAGNOSIS — F4323 Adjustment disorder with mixed anxiety and depressed mood: Secondary | ICD-10-CM | POA: Diagnosis not present

## 2023-08-27 DIAGNOSIS — M5481 Occipital neuralgia: Secondary | ICD-10-CM | POA: Diagnosis not present

## 2023-08-27 DIAGNOSIS — M9902 Segmental and somatic dysfunction of thoracic region: Secondary | ICD-10-CM | POA: Diagnosis not present

## 2023-08-27 DIAGNOSIS — M531 Cervicobrachial syndrome: Secondary | ICD-10-CM | POA: Diagnosis not present

## 2023-08-27 DIAGNOSIS — M9901 Segmental and somatic dysfunction of cervical region: Secondary | ICD-10-CM | POA: Diagnosis not present

## 2023-09-02 DIAGNOSIS — F4323 Adjustment disorder with mixed anxiety and depressed mood: Secondary | ICD-10-CM | POA: Diagnosis not present

## 2023-09-09 DIAGNOSIS — F4323 Adjustment disorder with mixed anxiety and depressed mood: Secondary | ICD-10-CM | POA: Diagnosis not present

## 2023-09-16 DIAGNOSIS — F4323 Adjustment disorder with mixed anxiety and depressed mood: Secondary | ICD-10-CM | POA: Diagnosis not present

## 2023-09-23 DIAGNOSIS — F4323 Adjustment disorder with mixed anxiety and depressed mood: Secondary | ICD-10-CM | POA: Diagnosis not present

## 2023-09-24 DIAGNOSIS — M9901 Segmental and somatic dysfunction of cervical region: Secondary | ICD-10-CM | POA: Diagnosis not present

## 2023-09-24 DIAGNOSIS — M5481 Occipital neuralgia: Secondary | ICD-10-CM | POA: Diagnosis not present

## 2023-09-24 DIAGNOSIS — M531 Cervicobrachial syndrome: Secondary | ICD-10-CM | POA: Diagnosis not present

## 2023-09-24 DIAGNOSIS — M9902 Segmental and somatic dysfunction of thoracic region: Secondary | ICD-10-CM | POA: Diagnosis not present

## 2023-09-30 DIAGNOSIS — F4323 Adjustment disorder with mixed anxiety and depressed mood: Secondary | ICD-10-CM | POA: Diagnosis not present

## 2023-10-07 DIAGNOSIS — F4323 Adjustment disorder with mixed anxiety and depressed mood: Secondary | ICD-10-CM | POA: Diagnosis not present

## 2023-10-21 DIAGNOSIS — F4323 Adjustment disorder with mixed anxiety and depressed mood: Secondary | ICD-10-CM | POA: Diagnosis not present

## 2023-10-22 DIAGNOSIS — R3129 Other microscopic hematuria: Secondary | ICD-10-CM | POA: Diagnosis not present

## 2023-10-22 DIAGNOSIS — R310 Gross hematuria: Secondary | ICD-10-CM | POA: Diagnosis not present

## 2023-10-22 DIAGNOSIS — R109 Unspecified abdominal pain: Secondary | ICD-10-CM | POA: Diagnosis not present

## 2023-10-26 DIAGNOSIS — M9902 Segmental and somatic dysfunction of thoracic region: Secondary | ICD-10-CM | POA: Diagnosis not present

## 2023-10-26 DIAGNOSIS — M531 Cervicobrachial syndrome: Secondary | ICD-10-CM | POA: Diagnosis not present

## 2023-10-26 DIAGNOSIS — M5481 Occipital neuralgia: Secondary | ICD-10-CM | POA: Diagnosis not present

## 2023-10-26 DIAGNOSIS — M9901 Segmental and somatic dysfunction of cervical region: Secondary | ICD-10-CM | POA: Diagnosis not present

## 2023-10-28 DIAGNOSIS — F4323 Adjustment disorder with mixed anxiety and depressed mood: Secondary | ICD-10-CM | POA: Diagnosis not present

## 2023-11-01 DIAGNOSIS — F411 Generalized anxiety disorder: Secondary | ICD-10-CM | POA: Diagnosis not present

## 2023-11-01 DIAGNOSIS — F332 Major depressive disorder, recurrent severe without psychotic features: Secondary | ICD-10-CM | POA: Diagnosis not present

## 2023-11-04 DIAGNOSIS — F332 Major depressive disorder, recurrent severe without psychotic features: Secondary | ICD-10-CM | POA: Diagnosis not present

## 2023-11-11 DIAGNOSIS — F332 Major depressive disorder, recurrent severe without psychotic features: Secondary | ICD-10-CM | POA: Diagnosis not present

## 2023-11-18 DIAGNOSIS — F4323 Adjustment disorder with mixed anxiety and depressed mood: Secondary | ICD-10-CM | POA: Diagnosis not present

## 2023-11-25 DIAGNOSIS — F4323 Adjustment disorder with mixed anxiety and depressed mood: Secondary | ICD-10-CM | POA: Diagnosis not present

## 2023-12-07 DIAGNOSIS — M9903 Segmental and somatic dysfunction of lumbar region: Secondary | ICD-10-CM | POA: Diagnosis not present

## 2023-12-07 DIAGNOSIS — M9901 Segmental and somatic dysfunction of cervical region: Secondary | ICD-10-CM | POA: Diagnosis not present

## 2023-12-07 DIAGNOSIS — M5481 Occipital neuralgia: Secondary | ICD-10-CM | POA: Diagnosis not present

## 2023-12-07 DIAGNOSIS — M531 Cervicobrachial syndrome: Secondary | ICD-10-CM | POA: Diagnosis not present

## 2023-12-07 DIAGNOSIS — M9902 Segmental and somatic dysfunction of thoracic region: Secondary | ICD-10-CM | POA: Diagnosis not present

## 2023-12-09 DIAGNOSIS — F4323 Adjustment disorder with mixed anxiety and depressed mood: Secondary | ICD-10-CM | POA: Diagnosis not present

## 2023-12-23 DIAGNOSIS — F411 Generalized anxiety disorder: Secondary | ICD-10-CM | POA: Diagnosis not present

## 2023-12-27 DIAGNOSIS — F332 Major depressive disorder, recurrent severe without psychotic features: Secondary | ICD-10-CM | POA: Diagnosis not present

## 2023-12-27 DIAGNOSIS — F411 Generalized anxiety disorder: Secondary | ICD-10-CM | POA: Diagnosis not present

## 2023-12-27 DIAGNOSIS — F330 Major depressive disorder, recurrent, mild: Secondary | ICD-10-CM | POA: Diagnosis not present

## 2024-01-20 DIAGNOSIS — F330 Major depressive disorder, recurrent, mild: Secondary | ICD-10-CM | POA: Diagnosis not present

## 2024-01-25 DIAGNOSIS — M9901 Segmental and somatic dysfunction of cervical region: Secondary | ICD-10-CM | POA: Diagnosis not present

## 2024-01-25 DIAGNOSIS — M5481 Occipital neuralgia: Secondary | ICD-10-CM | POA: Diagnosis not present

## 2024-01-25 DIAGNOSIS — M531 Cervicobrachial syndrome: Secondary | ICD-10-CM | POA: Diagnosis not present

## 2024-01-25 DIAGNOSIS — M9902 Segmental and somatic dysfunction of thoracic region: Secondary | ICD-10-CM | POA: Diagnosis not present

## 2024-01-28 DIAGNOSIS — M79632 Pain in left forearm: Secondary | ICD-10-CM | POA: Diagnosis not present

## 2024-02-17 DIAGNOSIS — F330 Major depressive disorder, recurrent, mild: Secondary | ICD-10-CM | POA: Diagnosis not present

## 2024-03-02 DIAGNOSIS — F411 Generalized anxiety disorder: Secondary | ICD-10-CM | POA: Diagnosis not present

## 2024-03-16 DIAGNOSIS — F4323 Adjustment disorder with mixed anxiety and depressed mood: Secondary | ICD-10-CM | POA: Diagnosis not present
